# Patient Record
Sex: Female | Born: 1990 | Hispanic: Yes | Marital: Married | Smoking: Current some day smoker
Health system: Southern US, Community
[De-identification: ages and names within clinical notes are randomized; demographics above are authoritative.]

## PROBLEM LIST (undated history)

## (undated) DIAGNOSIS — J45909 Unspecified asthma, uncomplicated: Secondary | ICD-10-CM

## (undated) HISTORY — DX: Unspecified asthma, uncomplicated: J45.909

## (undated) HISTORY — PX: DIAGNOSTIC LAPAROSCOPY WITH REMOVAL OF ECTOPIC PREGNANCY: SHX6449

## (undated) HISTORY — PX: DILATION AND CURETTAGE OF UTERUS: SHX78

---
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
## 2009-05-28 HISTORY — PX: OTHER SURGICAL HISTORY: SHX169

## 2009-10-21 ENCOUNTER — Emergency Department: Payer: Self-pay | Admitting: Emergency Medicine

## 2010-05-28 HISTORY — PX: DILATION AND CURETTAGE OF UTERUS: SHX78

## 2010-05-28 HISTORY — PX: DIAGNOSTIC LAPAROSCOPY WITH REMOVAL OF ECTOPIC PREGNANCY: SHX6449

## 2010-08-02 ENCOUNTER — Emergency Department: Payer: Self-pay | Admitting: Emergency Medicine

## 2011-04-20 ENCOUNTER — Emergency Department: Payer: Self-pay | Admitting: Emergency Medicine

## 2011-04-22 ENCOUNTER — Inpatient Hospital Stay: Payer: Self-pay | Admitting: Obstetrics and Gynecology

## 2011-10-09 ENCOUNTER — Emergency Department: Payer: Self-pay | Admitting: Emergency Medicine

## 2011-10-09 LAB — COMPREHENSIVE METABOLIC PANEL
Albumin: 4.3 g/dL (ref 3.4–5.0)
BUN: 10 mg/dL (ref 7–18)
Bilirubin,Total: 0.2 mg/dL (ref 0.2–1.0)
Calcium, Total: 9.1 mg/dL (ref 8.5–10.1)
Chloride: 106 mmol/L (ref 98–107)
Co2: 24 mmol/L (ref 21–32)
Creatinine: 0.54 mg/dL — ABNORMAL LOW (ref 0.60–1.30)
EGFR (African American): >60
Glucose: 86 mg/dL (ref 65–99)
Osmolality: 278 (ref 275–301)
Sodium: 140 mmol/L (ref 136–145)

## 2011-10-09 LAB — URINALYSIS, COMPLETE
Bilirubin,UR: NEGATIVE
Blood: NEGATIVE
Ketone: NEGATIVE
Nitrite: NEGATIVE
Protein: NEGATIVE
RBC,UR: 1 /HPF (ref 0–5)
Squamous Epithelial: 4

## 2011-10-09 LAB — PREGNANCY, URINE: Pregnancy Test, Urine: NEGATIVE m[IU]/mL

## 2011-10-09 LAB — CBC
MCH: 30.6 pg (ref 26.0–34.0)
MCV: 90 fL (ref 80–100)
Platelet: 310 10*3/uL (ref 150–440)
RBC: 4.47 10*6/uL (ref 3.80–5.20)
RDW: 13.6 % (ref 11.5–14.5)
WBC: 13.2 10*3/uL — ABNORMAL HIGH (ref 3.6–11.0)

## 2012-05-28 HISTORY — PX: WISDOM TOOTH EXTRACTION: SHX21

## 2012-10-09 ENCOUNTER — Emergency Department: Payer: Self-pay | Admitting: Emergency Medicine

## 2012-10-09 LAB — URINALYSIS, COMPLETE
Bilirubin,UR: NEGATIVE
Blood: NEGATIVE
Glucose,UR: NEGATIVE mg/dL (ref 0–75)
Nitrite: NEGATIVE
Ph: 5 (ref 4.5–8.0)
WBC UR: 3 /HPF (ref 0–5)

## 2012-10-09 LAB — WET PREP, GENITAL

## 2012-10-09 LAB — CBC
HCT: 36.9 % (ref 35.0–47.0)
MCH: 30.5 pg (ref 26.0–34.0)
MCHC: 33.9 g/dL (ref 32.0–36.0)
Platelet: 283 10*3/uL (ref 150–440)
RBC: 4.1 10*6/uL (ref 3.80–5.20)
RDW: 13.2 % (ref 11.5–14.5)

## 2012-10-09 LAB — BASIC METABOLIC PANEL
BUN: 9 mg/dL (ref 7–18)
Calcium, Total: 9.3 mg/dL (ref 8.5–10.1)
Co2: 25 mmol/L (ref 21–32)
EGFR (African American): >60
EGFR (Non-African Amer.): >60
Potassium: 4 mmol/L (ref 3.5–5.1)

## 2012-10-09 LAB — HCG, QUANTITATIVE, PREGNANCY: Beta Hcg, Quant.: 85810 m[IU]/mL — ABNORMAL HIGH

## 2012-10-10 LAB — GC/CHLAMYDIA PROBE AMP

## 2014-06-29 IMAGING — US US OB < 14 WEEKS - US OB TV
1 series · 14 of 28 positions shown · non-contrast
Comparison: none

REASON FOR EXAM: L pelvic pain
COMMENTS:

PROCEDURE:     US  - US OB LESS THAN 14 WEEKS/W TRANS  - October 09, 2012 [DATE]
RESULT:
Procedure: Real-time transabdominal and transvaginal obstetrical ultrasound
complete was obtained of the maternal pelvis at a less than 14 week
gestation. Image documentation was performed.

[Series 1: us ob < 14 weeks - us ob tv · 0.23mm/px · 45 acquisitions, 14 frames shown]
[im 2/45]
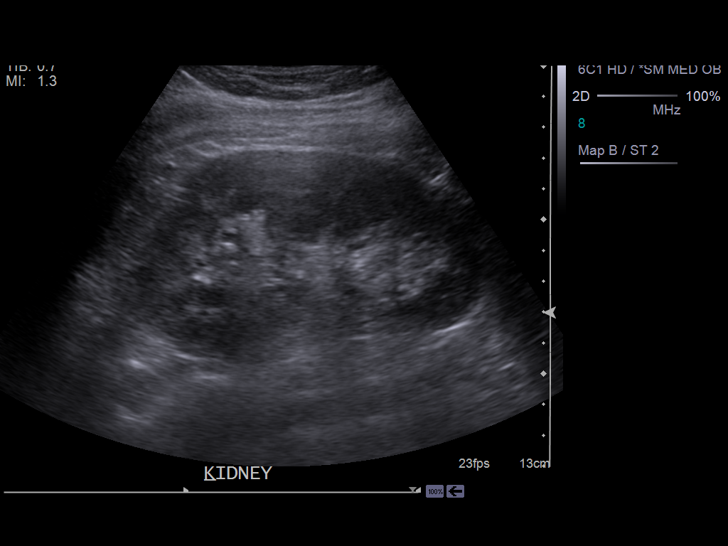
[im 5/45]
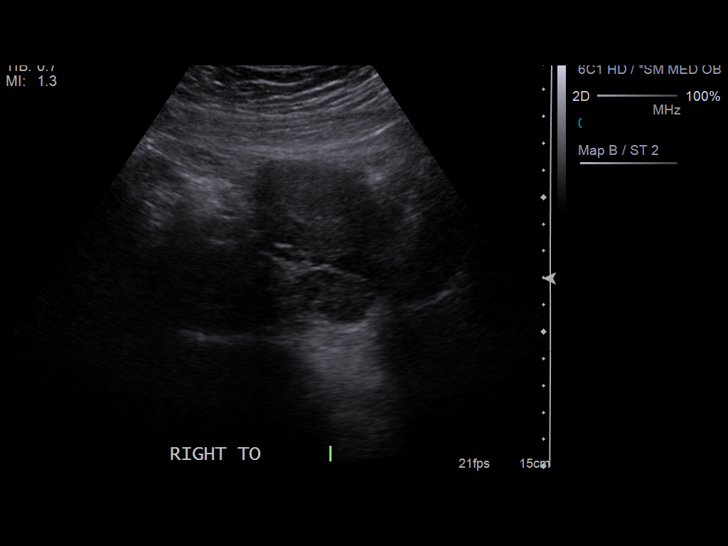
[im 9/45]
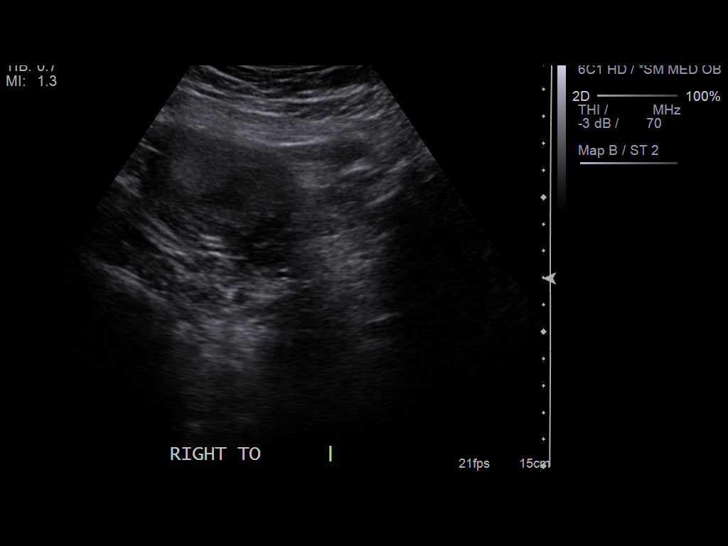
[im 12/45]
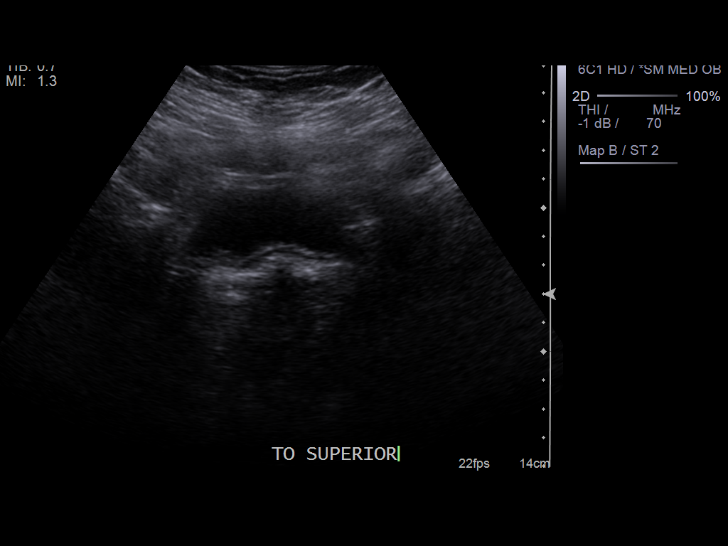
[im 15/45]
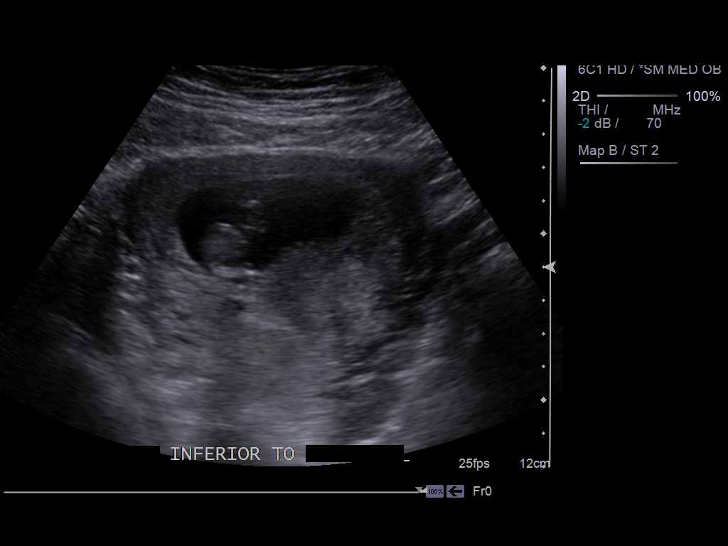
[im 18/45]
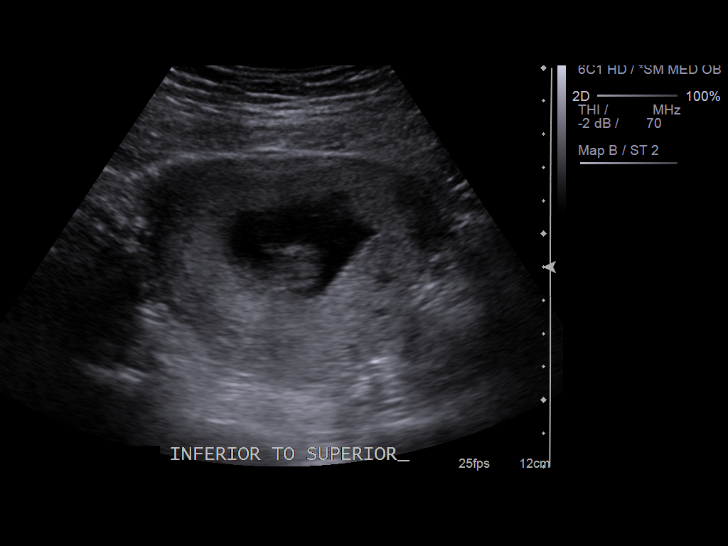
[im 22/45]
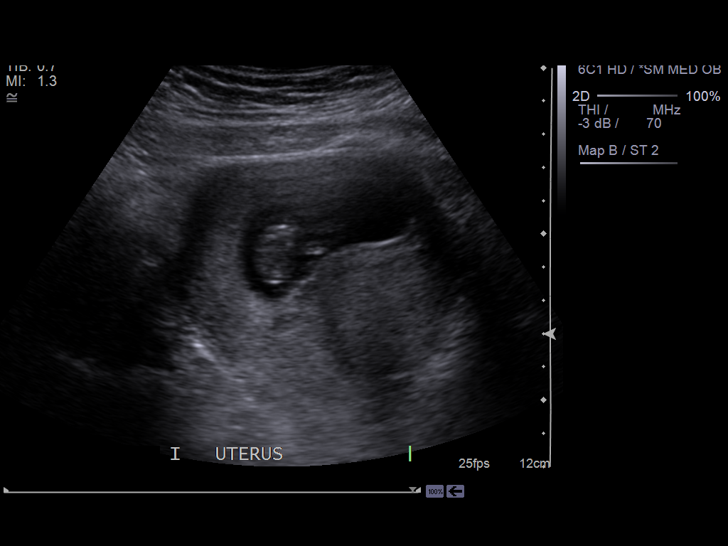
[im 25/45]
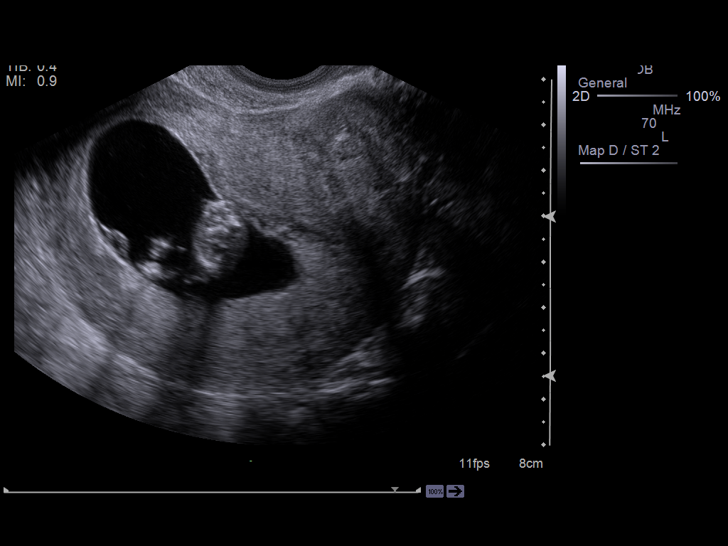
[im 28/45]
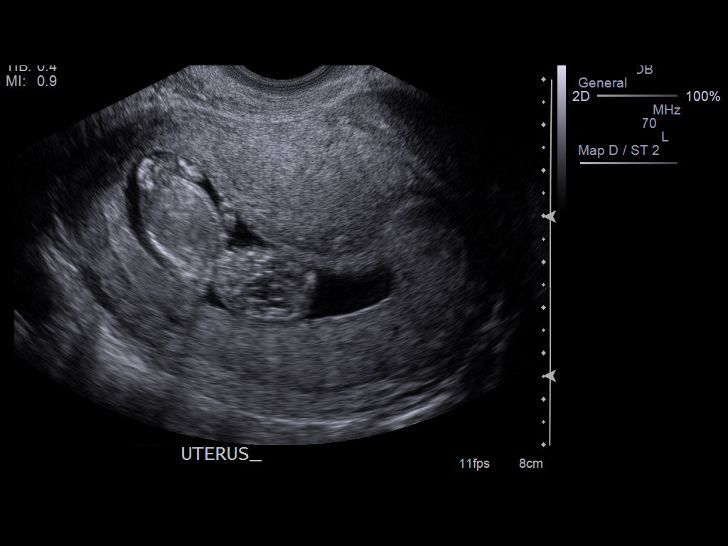
[im 31/45]
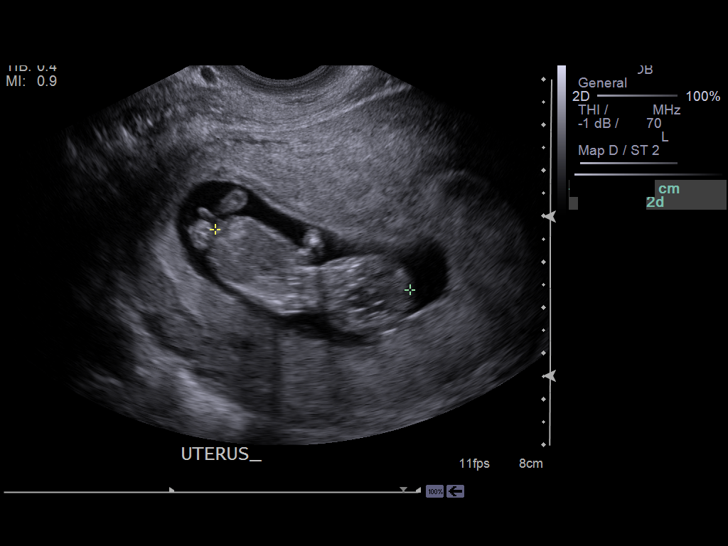
[im 35/45]
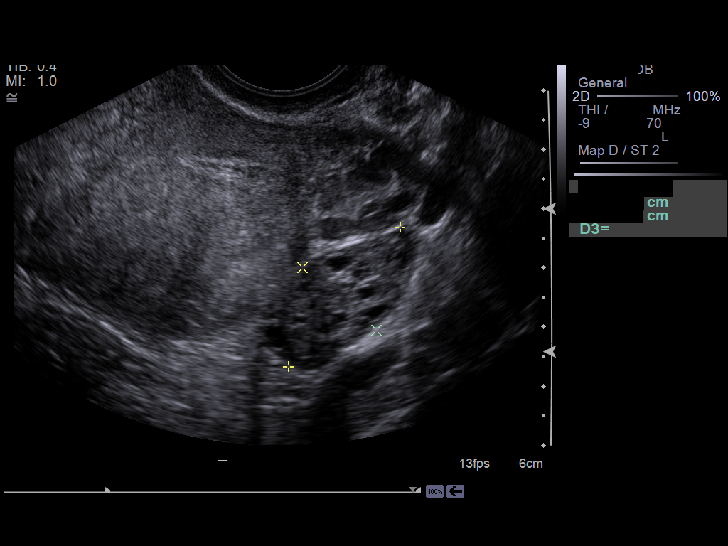
[im 38/45]
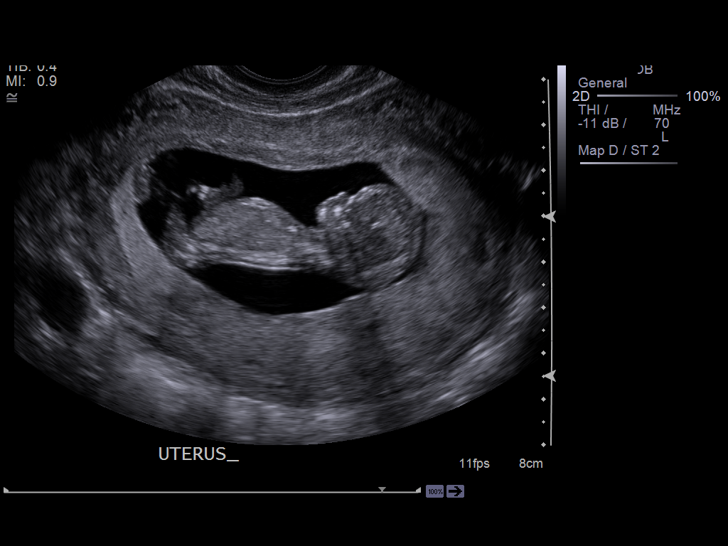
[im 41/45]
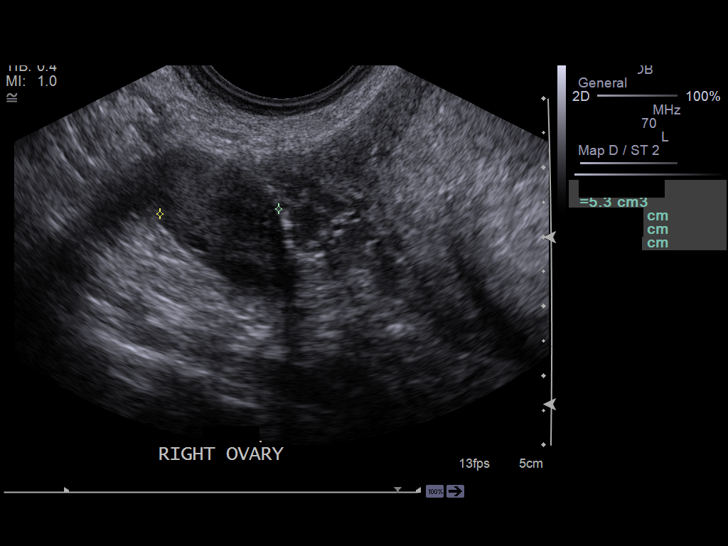
[im 45/45]
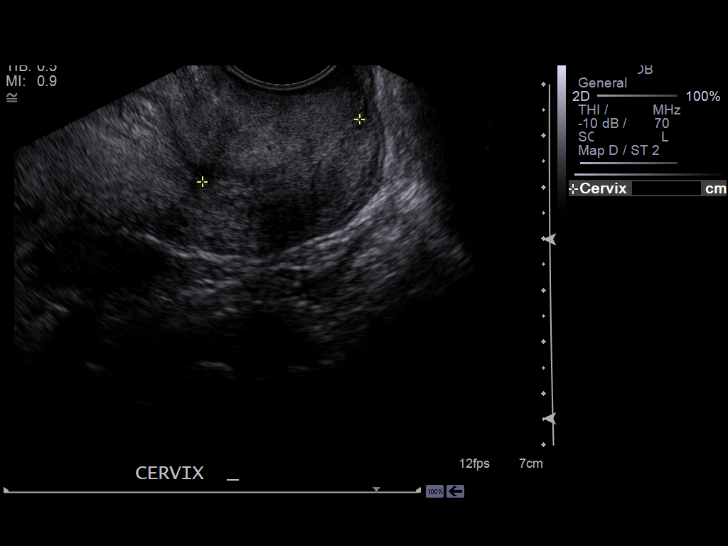

[14 of 28 positions shown; findings below may reference images not displayed]

FINDINGS: A single viable intrauterine pregnancy with estimated gestational
age based upon crown-rump length measurements of 11 weeks-5 days. Cardiac
activity is appreciated with a rate of 153 beats per minute. The uterus is
unremarkable. The ovaries are unremarkable. There no evidence of pelvic free
fluid or loculated fluid collections.
IMPRESSION: 1. Single viable intrauterine pregnancy as described above.
2. Dr. Rivard of the Emergency Department was informed of these findings
via preliminary faxed report.

## 2018-08-11 ENCOUNTER — Encounter: Payer: Self-pay | Admitting: Advanced Practice Midwife

## 2018-08-11 ENCOUNTER — Other Ambulatory Visit: Payer: Self-pay

## 2018-08-11 ENCOUNTER — Other Ambulatory Visit (HOSPITAL_COMMUNITY)
Admission: RE | Admit: 2018-08-11 | Discharge: 2018-08-11 | Disposition: A | Discharge status: Home / Self Care | Payer: Medicaid Other | Source: Ambulatory Visit | Attending: Advanced Practice Midwife | Admitting: Advanced Practice Midwife

## 2018-08-11 ENCOUNTER — Ambulatory Visit (INDEPENDENT_AMBULATORY_CARE_PROVIDER_SITE_OTHER): Payer: Medicaid Other | Admitting: Advanced Practice Midwife

## 2018-08-11 VITALS — BP 108/66 | HR 93 | Ht 64.0 in | Wt 187.0 lb

## 2018-08-11 DIAGNOSIS — Z113 Encounter for screening for infections with a predominantly sexual mode of transmission: Secondary | ICD-10-CM

## 2018-08-11 DIAGNOSIS — Z3481 Encounter for supervision of other normal pregnancy, first trimester: Secondary | ICD-10-CM | POA: Diagnosis not present

## 2018-08-11 DIAGNOSIS — Z124 Encounter for screening for malignant neoplasm of cervix: Secondary | ICD-10-CM | POA: Diagnosis not present

## 2018-08-11 DIAGNOSIS — Z3A01 Less than 8 weeks gestation of pregnancy: Secondary | ICD-10-CM

## 2018-08-11 DIAGNOSIS — Z3480 Encounter for supervision of other normal pregnancy, unspecified trimester: Secondary | ICD-10-CM

## 2018-08-11 NOTE — Progress Notes (Signed)
New Obstetric Patient H&P    Chief Complaint: "Desires prenatal care"   History of Present Illness: Patient is a 28 y.o. [id number] Hispanic or Latino female, presents with amenorrhea and positive home pregnancy test. Patient's last menstrual period was 06/21/2018 (exact date). and based on her  LMP, her EDD is Estimated Date of Delivery: 03/28/19 and her EGA is [redacted]w[redacted]d. Cycles are 5. days, irregular, and occur approximately every : 1-2 months. Her last pap smear was 3 years ago and was no abnormalities.    She had a urine pregnancy test which was positive 3 week(s)  ago. Her last menstrual period was normal and lasted for  5 or 6 day(s). Since her LMP she claims she has experienced breast tenderness, fatigue, nausea. She denies vaginal bleeding. Her past medical history is noncontributory. Her prior pregnancies are notable for FT SVD 2010, D&C following SAB August 2012 and laparoscopic surgery for ruptured ectopic pregnancy November 2012, FT SVD 2014, FT SVD 2016, FT SVD 2018- all in [place], [place].  Since her LMP, she admits to the use of tobacco products  She quit with +HPT She claims she has gained   3 pounds since the start of her pregnancy.  There are cats in the home in the home  no  She admits close contact with children on a regular basis  yes  She has had chicken pox in the past yes She has had Tuberculosis exposures, symptoms, or previously tested positive for TB   no Current or past history of domestic violence. no  Genetic Screening/Teratology Counseling: (Includes patient, baby's father, or anyone in either family with:)   1. Patient's age >/= 66 at [facility] [address]  no 2. Thalassemia ([place], [place], Mediterranean, or Asian background): MCV<80  no 3. Neural tube defect (meningomyelocele, spina bifida, anencephaly)  no 4. Congenital heart defect  no  5. Down syndrome  no 6. Tay-Sachs (Jewish, [place]  no 7. Canavan's Disease  no 8. Sickle cell disease or trait (African)  no  9.  Hemophilia or other blood disorders  no  10. Muscular dystrophy  no  11. Cystic fibrosis  no  12. Huntington's Chorea  no  13. Mental retardation/autism  no 14. Other inherited genetic or chromosomal disorder  no 15. Maternal metabolic disorder (DM, PKU, etc)  no 16. Patient or FOB with a child with a birth defect not listed above no  16a. Patient or FOB with a birth defect themselves no 17. Recurrent pregnancy loss, or stillbirth  no  18. Any medications since LMP other than prenatal vitamins (include vitamins, supplements, OTC meds, drugs, alcohol)  no 19. Any other genetic/environmental exposure to discuss  no  Infection History:   1. Lives with someone with TB or TB exposed  no  2. Patient or partner has history of genital herpes  no 3. Rash or viral illness since LMP  no 4. History of STI (GC, CT, HPV, syphilis, HIV)  no 5. History of recent travel :  no  Other pertinent information:  Patient desires tubal ligation after pregnancy    Review of Systems:10 point review of systems negative unless otherwise noted in HPI  Past Medical History:  Past Medical History:  Diagnosis Date  . Asthma     Past Surgical History:  Past Surgical History:  Procedure Laterality Date  . DIAGNOSTIC LAPAROSCOPY WITH REMOVAL OF ECTOPIC PREGNANCY     Left tube  . DILATION AND CURETTAGE OF UTERUS      Gynecologic History:  Patient's last menstrual period was 06/21/2018 (exact date).  Obstetric History: [id number]  Family History:  History reviewed. No pertinent family history.  No family history of Gyn cancer  Social History:  Social History   Socioeconomic History  . Marital status: Married    Spouse name: Not on file  . Number of children: Not on file  . Years of education: Not on file  . Highest education level: Not on file  Occupational History  . Not on file  Social Needs  . Financial resource strain: Not on file  . Food insecurity:    Worry: Not on file    Inability: Not  on file  . Transportation needs:    Medical: Not on file    Non-medical: Not on file  Tobacco Use  . Smoking status: Former Games developer  . Smokeless tobacco: Never Used  . Tobacco comment: Quit with 2020 pregnancy  Substance and Sexual Activity  . Alcohol use: Not on file  . Drug use: Not on file  . Sexual activity: Yes    Birth control/protection: None  Lifestyle  . Physical activity:    Days per week: Not on file    Minutes per session: Not on file  . Stress: Not on file  Relationships  . Social connections:    Talks on phone: Not on file    Gets together: Not on file    Attends religious service: Not on file    Active member of club or organization: Not on file    Attends meetings of clubs or organizations: Not on file    Relationship status: Not on file  . Intimate partner violence:    Fear of current or ex partner: Not on file    Emotionally abused: Not on file    Physically abused: Not on file    Forced sexual activity: Not on file  Other Topics Concern  . Not on file  Social History Narrative  . Not on file    Allergies:  Allergies  Allergen Reactions  . Penicillins Hives    As a child    Medications: Prior to Admission medications   Medication Sig Start Date End Date Taking? Authorizing Provider  albuterol (PROVENTIL HFA;VENTOLIN HFA) 108 (90 Base) MCG/ACT inhaler INL 1 TO 2 PFS PO Q 4 TO 6 H PRF [facility] 07/28/18   [provider]    Physical Exam Vitals: Blood pressure 108/66, pulse 93, height 5\' 4"  (1.626 m), weight 187 lb (84.8 kg), last menstrual period 06/23/2018.  General: NAD HEENT: normocephalic, anicteric Thyroid: no enlargement, no palpable nodules Pulmonary: No increased work of breathing, CTAB Cardiovascular: RRR, distal pulses 2+ Abdomen: NABS, soft, non-tender, non-distended.  Umbilicus without lesions.  No hepatomegaly, splenomegaly or masses palpable. No evidence of hernia  Genitourinary:  External: Normal external female genitalia.   Normal urethral meatus, normal Bartholin's and Skene's glands.    Vagina: Normal vaginal mucosa, no evidence of prolapse.    Cervix: Grossly normal in appearance, no bleeding, no CMT  Uterus: non-tender  Adnexa: non-tender  Rectal: deferred Extremities: no edema, erythema, or tenderness Neurologic: Grossly intact Psychiatric: mood appropriate, affect full   Assessment: 28 y.o. [id number] at [redacted]w[redacted]d presenting to initiate prenatal care  Plan: 1) Avoid alcoholic beverages. 2) Patient encouraged not to smoke.  3) Discontinue the use of all non-medicinal drugs and chemicals.  4) Take prenatal vitamins daily.  5) Nutrition, food safety (fish, cheese advisories, and high nitrite foods) and exercise discussed. 6) Hospital and practice  style discussed with cross coverage system.  7) Genetic Screening, such as with 1st Trimester Screening, cell free fetal DNA, AFP testing, and Ultrasound, as well as with amniocentesis and CVS as appropriate, is discussed with patient. At the conclusion of today's visit patient requested cell free DNA genetic testing 8) Patient is asked about travel to areas at risk for the Zika virus, and counseled to avoid travel and exposure to mosquitoes or sexual partners who may have themselves been exposed to the virus. Testing is discussed, and will be ordered as appropriate.  9) PAPtima and Urine Culture done today 10) Dating at [facility] followed by ROB in clinic- 1-2 weeks 11) NOB panel, early 1 hr gtt and MaterniT 21 at 10+ week visit    Tresea Mall, CNM [facility], [facility] 08/11/2018, 4:16 PM

## 2018-08-11 NOTE — Progress Notes (Signed)
NOB 

## 2018-08-11 NOTE — Patient Instructions (Signed)
Exercise During Pregnancy For people of all ages, exercise is an important part of being healthy. Exercise improves heart and lung function and helps to maintain strength, flexibility, and a healthy body weight. Exercise also boosts energy levels and elevates mood. For most women, maintaining an exercise routine throughout pregnancy is recommended. It is only on rare occasions and with certain medical conditions or pregnancy complications that women may be asked to limit or avoid exercise during pregnancy. What are some other benefits to exercising during pregnancy? Along with maintaining strength and flexibility, exercising throughout pregnancy can help to:  Keep strength in muscles that are very important during labor and childbirth.  Decrease low back pain during pregnancy.  Decrease the risk of developing gestational diabetes mellitus (GDM).  Improve blood sugar (glucose) control for women who have GDM.  Decrease the risk of developing preeclampsia. This is a serious condition that causes high blood pressure along with other symptoms, such as swelling and headaches.  Decrease the risk of cesarean delivery.  Speed up the recovery after giving birth. How often should I exercise? Unless your health care provider gives you different instructions, you should try to exercise on most days or all days of the week. In general, try to exercise with moderate intensity for about 150 minutes per week. This can be spread out across several days, such as exercising for 30 minutes per day on 5 days of each week. You can tell that you are exercising at a moderate intensity if you have a higher heart rate and faster breathing, but you are still able to hold a conversation. What types of moderate-intensity exercise are recommended during pregnancy? There are many types of exercise that are safe for you to do during pregnancy. Unless your health care provider gives you different instructions, do a variety of  exercises that safely increase your heart and breathing (cardiopulmonary) rates and help you to build and maintain muscle strength (strength training). You should always be able to talk in full sentences while exercising during pregnancy. Some examples of exercising that is safe to do during pregnancy include:  Brisk walking or hiking.  Swimming.  Water aerobics.  Riding a stationary bike.  Strength training.  Modified yoga or Pilates. Tell your instructor that you are pregnant. Avoid overstretching and avoid lying on your back for long periods of time.  Running or jogging. Only choose this type of exercise if: ? You ran or jogged regularly before your pregnancy. ? You can run or jog and still talk in complete sentences. What types of exercise should I not do during pregnancy? Depending on your level of fitness and whether you exercised regularly before your pregnancy, you may be advised to limit vigorous-intensity exercise during your pregnancy. You can tell that you are exercising at a vigorous intensity if you are breathing much harder and faster and cannot hold a conversation while exercising. Some examples of exercising that you should avoid during pregnancy include:  Contact sports.  Activities that place you at risk for falling on or being hit in the belly, such as downhill skiing, water skiing, surfing, rock climbing, cycling, gymnastics, and horseback riding.  Scuba diving.  Sky diving.  Yoga or Pilates in a room that is heated to extreme temperatures ("hot yoga" or "hot Pilates").  Jogging or running, unless you ran or jogged regularly before your pregnancy. While jogging or running, you should always be able to talk in full sentences. Do not run or jog so vigorously that you   are unable to have a conversation.  If you are not used to exercising at elevation (more than 6,000 feet above sea level), do not do so during your pregnancy. When should I avoid exercising during  pregnancy? Certain medical conditions can make it unsafe to exercise during pregnancy, or they may increase your risk of miscarriage or early labor and birth. Some of these conditions include:  Some types of heart disease.  Some types of lung disease.  Placenta previa. This is when the placenta partially or completely covers the opening of the uterus (cervix).  Frequent bleeding from the vagina during your pregnancy.  Incompetent cervix. This is when your cervix does not remain as tightly closed during pregnancy as it should.  Premature labor.  Ruptured membranes. This is when the protective sac (amniotic sac) opens up and amniotic fluid leaks from your vagina.  Severely low blood count (anemia).  Preeclampsia or pregnancy-caused high blood pressure.  Carrying more than one baby (multiple gestation) and having an additional risk of early labor.  Poorly controlled diabetes.  Being severely underweight or severely overweight.  Intrauterine growth restriction. This is when your baby's growth and development during pregnancy are slower than expected.  Other medical conditions. Ask your health care provider if any apply to you. What else should I know about exercising during pregnancy? You should take these precautions while exercising during pregnancy:  Avoid overheating. ? Wear loose-fitting, breathable clothes. ? Do not exercise in very high temperatures.  Avoid dehydration. Drink enough water before, during, and after exercise to keep your urine clear or pale yellow.  Avoid overstretching. Because of hormone changes during pregnancy, it is easy to overstretch muscles, tendons, and ligaments during pregnancy.  Start slowly and ask your health care provider to recommend types of exercise that are safe for you, if exercising regularly is new for you. Pregnancy is not a time for exercising to lose weight. When should I seek medical care? You should stop exercising and call your  health care provider if you have any unusual symptoms, such as:  Mild uterine contractions or abdominal cramping.  Dizziness that does not improve with rest. When should I seek immediate medical care? You should stop exercising and call your local emergency services (911 in the [place] if you have any unusual symptoms, such as:  Sudden, severe pain in your low back or your belly.  Uterine contractions or abdominal cramping that do not improve with rest.  Chest pain.  Bleeding or fluid leaking from your vagina.  Shortness of breath. This information is not intended to replace advice given to you by your health care provider. Make sure you discuss any questions you have with your health care provider. Document Released: 05/14/2005 Document Revised: 10/12/2015 Document Reviewed: 07/22/2014 Elsevier Interactive Patient Education  2019 [employer] Eating Plan for Pregnant Women While you are pregnant, your body requires additional nutrition to help support your growing baby. You also have a higher need for some vitamins and minerals, such as folic acid, calcium, iron, and vitamin D. Eating a healthy, well-balanced diet is very important for your health and your baby's health. Your need for extra calories varies for the three 3-month segments of your pregnancy (trimesters). For most women, it is recommended to consume:  150 extra calories a day during the first trimester.  300 extra calories a day during the second trimester.  300 extra calories a day during the third trimester. What are tips for following this plan?   Do   not try to lose weight or go on a diet during pregnancy.  Limit your overall intake of foods that have "empty calories." These are foods that have little nutritional value, such as sweets, desserts, candies, and sugar-sweetened beverages.  Eat a variety of foods (especially fruits and vegetables) to get a full range of vitamins and minerals.  Take a prenatal vitamin  to help meet your additional vitamin and mineral needs during pregnancy, specifically for folic acid, iron, calcium, and vitamin D.  Remember to stay active. Ask your health care provider what types of exercise and activities are safe for you.  Practice good food safety and cleanliness. Wash your hands before you eat and after you prepare raw meat. Wash all fruits and vegetables well before peeling or eating. Taking these actions can help to prevent food-borne illnesses that can be very dangerous to your baby, such as listeriosis. Ask your health care provider for more information about listeriosis. What does 150 extra calories look like? Healthy options that provide 150 extra calories each day could be any of the following:  6-8 oz (170-230 g) of plain low-fat yogurt with  cup of berries.  1 apple with 2 teaspoons (11 g) of peanut butter.  Cut-up vegetables with  cup (60 g) of hummus.  8 oz (230 mL) or 1 cup of low-fat chocolate milk.  1 stick of string cheese with 1 medium orange.  1 peanut butter and jelly sandwich that is made with one slice of whole-wheat bread and 1 tsp (5 g) of peanut butter. For 300 extra calories, you could eat two of those healthy options each day. What is a healthy amount of weight to gain? The right amount of weight gain for you is based on your BMI before you became pregnant. If your BMI:  Was less than 18 (underweight), you should gain 28-40 lb (13-18 kg).  Was 18-24.9 (normal), you should gain 25-35 lb (11-16 kg).  Was 25-29.9 (overweight), you should gain 15-25 lb (7-11 kg).  Was 30 or greater (obese), you should gain 11-20 lb (5-9 kg). What if I am having twins or multiples? Generally, if you are carrying twins or multiples:  You may need to eat 300-600 extra calories a day.  The recommended range for total weight gain is 25-54 lb (11-25 kg), depending on your BMI before pregnancy.  Talk with your health care provider to find out about  nutritional needs, weight gain, and exercise that is right for you. What foods can I eat?  Grains All grains. Choose whole grains, such as whole-wheat bread, oatmeal, or brown rice. Vegetables All vegetables. Eat a variety of colors and types of vegetables. Remember to wash your vegetables well before peeling or eating. Fruits All fruits. Eat a variety of colors and types of fruit. Remember to wash your fruits well before peeling or eating. Meats and other protein foods Lean meats, including chicken, turkey, fish, and lean cuts of beef, veal, or pork. If you eat fish or seafood, choose options that are higher in omega-3 fatty acids and lower in mercury, such as salmon, herring, mussels, trout, sardines, pollock, shrimp, crab, and lobster. Tofu. Tempeh. Beans. Eggs. Peanut butter and other nut butters. Make sure that all meats, poultry, and eggs are cooked to food-safe temperatures or "well-done." Two or more servings of fish are recommended each week in order to get the most benefits from omega-3 fatty acids that are found in seafood. Choose fish that are lower in mercury. You can   find more information online:  www.fda.gov Dairy Pasteurized milk and milk alternatives (such as almond milk). Pasteurized yogurt and pasteurized cheese. Cottage cheese. Sour cream. Beverages Water. Juices that contain 100% fruit juice or vegetable juice. Caffeine-free teas and decaffeinated coffee. Drinks that contain caffeine are okay to drink, but it is better to avoid caffeine. Keep your total caffeine intake to less than 200 mg each day (which is 12 oz or 355 mL of coffee, tea, or soda) or the limit as told by your health care provider. Fats and oils Fats and oils are okay to include in moderation. Sweets and desserts Sweets and desserts are okay to include in moderation. Seasoning and other foods All pasteurized condiments. The items listed above may not be a complete list of recommended foods and beverages.  Contact your dietitian for more options. What foods are not recommended? Vegetables Raw (unpasteurized) vegetable juices. Fruits Unpasteurized fruit juices. Meats and other protein foods Lunch meats, bologna, hot dogs, or other deli meats. (If you must eat those meats, reheat them until they are steaming hot.) Refrigerated pat, meat spreads from a meat counter, smoked seafood that is found in the refrigerated section of a store. Raw or undercooked meats, poultry, and eggs. Raw fish, such as sushi or sashimi. Fish that have high mercury content, such as tilefish, shark, swordfish, and king mackerel. To learn more about mercury in fish, talk with your health care provider or look for online resources, such as:  www.fda.gov Dairy Raw (unpasteurized) milk and any foods that have raw milk in them. Soft cheeses, such as feta, queso blanco, queso fresco, Brie, Camembert cheeses, blue-veined cheeses, and Panela cheese (unless it is made with pasteurized milk, which must be stated on the label). Beverages Alcohol. Sugar-sweetened beverages, such as sodas, teas, or energy drinks. Seasoning and other foods Homemade fermented foods and drinks, such as pickles, sauerkraut, or kombucha drinks. (Store-bought pasteurized versions of these are okay.) Salads that are made in a store or deli, such as ham salad, chicken salad, egg salad, tuna salad, and seafood salad. The items listed above may not be a complete list of foods and beverages to avoid. Contact your dietitian for more information. Where to find more information To calculate the number of calories you need based on your height, weight, and activity level, you can use an online calculator such as:  www.choosemyplate.gov/MyPlatePlan To calculate how much weight you should gain during pregnancy, you can use an online pregnancy weight gain calculator such as:  www.choosemyplate.gov/pregnancy-weight-gain-calculator Summary  While you are pregnant,  your body requires additional nutrition to help support your growing baby.  Eat a variety of foods, especially fruits and vegetables to get a full range of vitamins and minerals.  Practice good food safety and cleanliness. Wash your hands before you eat and after you prepare raw meat. Wash all fruits and vegetables well before peeling or eating. Taking these actions can help to prevent food-borne illnesses, such as listeriosis, that can be very dangerous to your baby.  Do not eat raw meat or fish. Do not eat fish that have high mercury content, such as tilefish, shark, swordfish, and king mackerel. Do not eat unpasteurized (raw) dairy.  Take a prenatal vitamin to help meet your additional vitamin and mineral needs during pregnancy, specifically for folic acid, iron, calcium, and vitamin D. This information is not intended to replace advice given to you by your health care provider. Make sure you discuss any questions you have with your health care   provider. Document Released: 02/26/2014 Document Revised: 02/08/2017 Document Reviewed: 02/08/2017 Elsevier Interactive Patient Education  2019 [employer] Prenatal Care Prenatal care is health care during pregnancy. It helps you and your unborn baby (fetus) stay as healthy as possible. Prenatal care may be provided by a midwife, a family practice health care provider, or a childbirth and pregnancy specialist (obstetrician). How does this affect me? During pregnancy, you will be closely monitored for any new conditions that might develop. To lower your risk of pregnancy complications, you and your health care provider will talk about any underlying conditions you have. How does this affect my baby? Early and consistent prenatal care increases the chance that your baby will be healthy during pregnancy. Prenatal care lowers the risk that your baby will be:  Born early (prematurely).  Smaller than expected at birth (small for gestational age). What  can I expect at the first prenatal care visit? Your first prenatal care visit will likely be the longest. You should schedule your first prenatal care visit as soon as you know that you are pregnant. Your first visit is a good time to talk about any questions or concerns you have about pregnancy. At your visit, you and your health care provider will talk about:  Your medical history, including: ? Any past pregnancies. ? Your family's medical history. ? The baby's father's medical history. ? Any long-term (chronic) health conditions you have and how you manage them. ? Any surgeries or procedures you have had. ? Any current over-the-counter or prescription medicines, herbs, or supplements you are taking.  Other factors that could pose a risk to your baby, including:  Your home setting and your stress levels, including: ? Exposure to abuse or violence. ? Household financial strain. ? Mental health conditions you have.  Your daily health habits, including diet and exercise. Your health care provider will also:  Measure your weight, height, and blood pressure.  Do a physical exam, including a pelvic and breast exam.  Perform blood tests and urine tests to check for: ? Urinary tract infection. ? Sexually transmitted infections (STIs). ? Low iron levels in your blood (anemia). ? Blood type and certain proteins on red blood cells (Rh antibodies). ? Infections and immunity to viruses, such as hepatitis B and rubella. ? HIV (human immunodeficiency virus).  Do an ultrasound to confirm your baby's growth and development and to help predict your estimated due date (EDD). This ultrasound is done with a probe that is inserted into the vagina (transvaginal ultrasound).  Discuss your options for genetic screening.  Give you information about how to keep yourself and your baby healthy, including: ? Nutrition and taking vitamins. ? Physical activity. ? How to manage pregnancy symptoms such as  nausea and vomiting (morning sickness). ? Infections and substances that may be harmful to your baby and how to avoid them. ? Food safety. ? Dental care. ? Working. ? Travel. ? Warning signs to watch for and when to call your health care provider. How often will I have prenatal care visits? After your first prenatal care visit, you will have regular visits throughout your pregnancy. The visit schedule is often as follows:  Up to week 28 of pregnancy: once every 4 weeks.  28-36 weeks: once every 2 weeks.  After 36 weeks: every week until delivery. Some women may have visits more or less often depending on any underlying health conditions and the health of the baby. Keep all follow-up and prenatal care visits as told by   your health care provider. This is important. What happens during routine prenatal care visits? Your health care provider will:  Measure your weight and blood pressure.  Check for fetal heart sounds.  Measure the height of your uterus in your abdomen (fundal height). This may be measured starting around week 20 of pregnancy.  Check the position of your baby inside your uterus.  Ask questions about your diet, sleeping patterns, and whether you can feel the baby move.  Review warning signs to watch for and signs of labor.  Ask about any pregnancy symptoms you are having and how you are dealing with them. Symptoms may include: ? Headaches. ? Nausea and vomiting. ? Vaginal discharge. ? Swelling. ? Fatigue. ? Constipation. ? Any discomfort, including back or pelvic pain. Make a list of questions to ask your health care provider at your routine visits. What tests might I have during prenatal care visits? You may have blood, urine, and imaging tests throughout your pregnancy, such as:  Urine tests to check for glucose, protein, or signs of infection.  Glucose tests to check for a form of diabetes that can develop during pregnancy (gestational diabetes mellitus).  This is usually done around week 24 of pregnancy.  An ultrasound to check your baby's growth and development and to check for birth defects. This is usually done around week 20 of pregnancy.  A test to check for group B strep (GBS) infection. This is usually done around week 36 of pregnancy.  Genetic testing. This may include blood or imaging tests, such as an ultrasound. Some genetic tests are done during the first trimester and some are done during the second trimester. What else can I expect during prenatal care visits? Your health care provider may recommend getting certain vaccines during pregnancy. These may include:  A yearly flu shot (annual influenza vaccine). This is especially important if you will be pregnant during flu season.  Tdap (tetanus, diphtheria, pertussis) vaccine. Getting this vaccine during pregnancy can protect your baby from whooping cough (pertussis) after birth. This vaccine may be recommended between weeks 27 and 36 of pregnancy. Later in your pregnancy, your health care provider may give you information about:  Childbirth and breastfeeding classes.  Choosing a health care provider for your baby.  Umbilical cord banking.  Breastfeeding.  Birth control after your baby is born.  The hospital labor and delivery unit and how to tour it.  Registering at the hospital before you go into labor. Where to find more information  Office on Women's Health: womenshealth.gov  American Pregnancy Association: americanpregnancy.org  March of Dimes: marchofdimes.org Summary  Prenatal care helps you and your baby stay as healthy as possible during pregnancy.  Your first prenatal care visit will most likely be the longest.  You will have visits and tests throughout your pregnancy to monitor your health and your baby's health.  Bring a list of questions to your visits to ask your health care provider.  Make sure to keep all follow-up and prenatal care visits with  your health care provider. This information is not intended to replace advice given to you by your health care provider. Make sure you discuss any questions you have with your health care provider. Document Released: 05/17/2003 Document Revised: 05/13/2017 Document Reviewed: 05/13/2017 Elsevier Interactive Patient Education  2019 [employer]  

## 2018-08-13 LAB — CYTOLOGY - PAP
CHLAMYDIA, DNA PROBE: NEGATIVE
DIAGNOSIS: NEGATIVE
NEISSERIA GONORRHEA: NEGATIVE
Trichomonas: NEGATIVE

## 2018-08-13 LAB — URINE CULTURE

## 2018-08-15 ENCOUNTER — Other Ambulatory Visit: Payer: Self-pay | Admitting: Advanced Practice Midwife

## 2018-08-15 DIAGNOSIS — Z3689 Encounter for other specified antenatal screening: Secondary | ICD-10-CM

## 2018-08-15 DIAGNOSIS — Z3480 Encounter for supervision of other normal pregnancy, unspecified trimester: Secondary | ICD-10-CM

## 2018-08-25 ENCOUNTER — Other Ambulatory Visit: Payer: Self-pay

## 2018-08-25 ENCOUNTER — Ambulatory Visit
Admission: RE | Admit: 2018-08-25 | Discharge: 2018-08-25 | Disposition: A | Discharge status: Home / Self Care | Payer: Medicaid Other | Source: Ambulatory Visit | Attending: Advanced Practice Midwife | Admitting: Advanced Practice Midwife

## 2018-08-25 ENCOUNTER — Ambulatory Visit (INDEPENDENT_AMBULATORY_CARE_PROVIDER_SITE_OTHER): Payer: Medicaid Other | Admitting: Obstetrics and Gynecology

## 2018-08-25 ENCOUNTER — Encounter: Payer: Self-pay | Admitting: Obstetrics and Gynecology

## 2018-08-25 DIAGNOSIS — Z3A09 9 weeks gestation of pregnancy: Secondary | ICD-10-CM | POA: Diagnosis not present

## 2018-08-25 DIAGNOSIS — Z3689 Encounter for other specified antenatal screening: Secondary | ICD-10-CM | POA: Insufficient documentation

## 2018-08-25 DIAGNOSIS — Z3480 Encounter for supervision of other normal pregnancy, unspecified trimester: Secondary | ICD-10-CM

## 2018-08-25 DIAGNOSIS — O208 Other hemorrhage in early pregnancy: Secondary | ICD-10-CM | POA: Diagnosis not present

## 2018-08-25 NOTE — Progress Notes (Signed)
    Virtual Visit via Telephone Note  I connected with Angela Luna on 08/25/18 at  3:30 PM EDT by telephone and verified that I am speaking with the correct person using two identifiers.   I discussed the limitations, risks, security and privacy concerns of performing an evaluation and management service by telephone and the availability of in person appointments. I also discussed with the patient that there may be a patient responsible charge related to this service. The patient expressed understanding and agreed to proceed.  She was at home and I was in my office.  History of Present Illness: 28 y.o. [id number] female at [redacted]w[redacted]d presents via telephone for a routine prenatal visit. She has no complaints. She had an ultrasound today at the hospital which confirms her EDD. There is a single, living IUP, with cardiac activity, and with a small SCH. She denies vaginal bleeding and any other concerns.    Observations/Objective: No exam today, due to telephone eVisit due to [facility] virus restriction on elective visits and procedures.  Prior visits reviewed along with ultrasounds/labs as indicated.  Assessment and Plan: Pregnancy#7 Problems (from 06/21/18 to present)    Problem Noted Resolved   Supervision of other normal pregnancy, antepartum 08/11/2018 by Tresea Mall, CNM No   Overview Signed 08/11/2018 12:27 PM by Tresea Mall, CNM    [facility] Prenatal Labs  Dating  Blood type:     Genetic Screen 1 Screen:    AFP:     Quad:     NIPS: Antibody:   Anatomic [place]  Rubella:   Varicella: @VZVIGG @  GTT Early: [ ]  Third trimester:  RPR:     Rhogam  HBsAg:     TDaP vaccine   Flu Shot: declined HIV:     Baby Food  Breast                              GBS:   Contraception Desires tubal: consent [ ]  Pap: 3/16 [ ]   CBB     CS/VBAC NA   Support Person Angela Luna             Follow Up Instructions:   I discussed the assessment and treatment plan with the patient. The patient was provided  an opportunity to ask questions and all were answered. The patient agreed with the plan and demonstrated an understanding of the instructions.   The patient was advised to call back or seek an in-person evaluation if the symptoms worsen or if the condition fails to improve as anticipated.  I provided 15 minutes of non-face-to-face time during this encounter.  Return in about 4 weeks (around 09/22/2018) for Routine Prenatal Appointment with labs(1h gtt).    Angela Mohair, MD, [name], [facility] 08/25/2018 5:12 PM

## 2018-08-25 NOTE — Patient Instructions (Signed)
   Hello,  Given the current COVID-19 pandemic, our practice is making changes in how we are providing care to our patients. We are limiting in-person visits for the safety of all of our patients.   As a practice, we have met to discuss the best way to minimize visits, but still provide excellent care to our expecting mothers.  We have decided on the following visit structure for low-risk pregnancies.  Initial Pregnancy visit will be conducted as a telephone or web visit.  Between 10-14 weeks  there will be one in-person visit for an ultrasound, lab work, and genetic screening. 20 weeks in-person visit with an anatomy ultrasound  28 weeks in-person office visit for a 1-hour glucose test and a TDAP vaccination 32 weeks in-person office visit 34 weeks telephone visit 36 weeks in-person office visit for GBS, chlamydia, and gonorrhea testing 38 weeks in-person office visit 40 weeks in-person office visit  Understandably, some patients will require more visits than what is outlined above. Additional visits will be determined on a case-by-case basis.   We will, as always, be available for emergencies or to address concerns that might arise between in-person visits. We ask that you allow [place] the opportunity to address any concerns over the phone or through a virtual visit first. We will be available to return your phone calls throughout the day.   If you are able to purchase a scale, a blood pressure machine, and a home fetal doppler visits could be limited further. This will help decrease your exposure risks, but these purchases are not a necessity.   Things seem to change daily and there is the possibility that this structure could change, please be patient as we adapt to a new way of caring for patients.   Thank you for trusting [place] with your prenatal care. Our practice values you and looks forward to providing you with excellent care.   Sincerely,   [facility], [place]

## 2018-09-22 ENCOUNTER — Other Ambulatory Visit: Payer: Self-pay

## 2018-09-22 ENCOUNTER — Other Ambulatory Visit: Payer: Medicaid Other

## 2018-09-22 ENCOUNTER — Encounter: Payer: Self-pay | Admitting: Advanced Practice Midwife

## 2018-09-22 ENCOUNTER — Ambulatory Visit (INDEPENDENT_AMBULATORY_CARE_PROVIDER_SITE_OTHER): Payer: Medicaid Other | Admitting: Advanced Practice Midwife

## 2018-09-22 VITALS — BP 108/70 | Wt 193.0 lb

## 2018-09-22 DIAGNOSIS — Z3481 Encounter for supervision of other normal pregnancy, first trimester: Secondary | ICD-10-CM

## 2018-09-22 DIAGNOSIS — Z3A13 13 weeks gestation of pregnancy: Secondary | ICD-10-CM | POA: Diagnosis not present

## 2018-09-22 DIAGNOSIS — Z3480 Encounter for supervision of other normal pregnancy, unspecified trimester: Secondary | ICD-10-CM

## 2018-09-22 NOTE — Progress Notes (Signed)
ROB/1 hr GTT- no concerns 

## 2018-09-22 NOTE — Patient Instructions (Signed)

## 2018-09-22 NOTE — Progress Notes (Signed)
  Routine Prenatal Care Visit  Subjective  Angela Luna is a 28 y.o. [id number] at [redacted]w[redacted]d being seen today for ongoing prenatal care.  She is currently monitored for the following issues for this low-risk pregnancy and has Supervision of other normal pregnancy, antepartum on their problem list.  ----------------------------------------------------------------------------------- Patient reports eczema flare up. Her eczema usually improves during pregnancy but despite her usual regimen of lotion it has gotten worse she thinks due to additional stress currently.    . Vag. Bleeding: None.   . Denies leaking of fluid.  ----------------------------------------------------------------------------------- The following portions of the patient's history were reviewed and updated as appropriate: allergies, current medications, past family history, past medical history, past social history, past surgical history and problem list. Problem list updated.   Objective  Blood pressure 108/70, weight 193 lb (87.5 kg), last menstrual period 06/21/2018. Pregravid weight 184 lb (83.5 kg) Total Weight Gain 9 lb (4.082 kg) Urinalysis: Urine Protein    Urine Glucose    Fetal Status: Fetal Heart Rate (bpm): 150         General:  Alert, oriented and cooperative. Patient is in no acute distress.  Skin: Skin is warm and dry. No rash noted.   Cardiovascular: Normal heart rate noted  Respiratory: Normal respiratory effort, no problems with respiration noted  Abdomen: Soft, gravid, appropriate for gestational age.       Pelvic:  Cervical exam deferred        Extremities: Normal range of motion.   Patches of eczema on legs especially left calf  Mental Status: Normal mood and affect. Normal behavior. Normal judgment and thought content.   Assessment   28 y.o. [id number] at [redacted]w[redacted]d by  03/28/2019, by Last Menstrual Period presenting for routine prenatal visit  Plan   Pregnancy#7 Problems (from 06/21/18 to present)    Problem Noted Resolved   Supervision of other normal pregnancy, antepartum 08/11/2018 by Tresea Mall, CNM No   Overview Signed 08/11/2018 12:27 PM by Tresea Mall, CNM    [facility] Prenatal Labs  Dating  Blood type:     Genetic Screen 1 Screen:    AFP:     Quad:     NIPS: Antibody:   Anatomic [place]  Rubella:   Varicella: @VZVIGG @  GTT Early: [ ]  Third trimester:  RPR:     Rhogam  HBsAg:     TDaP vaccine   Flu Shot: declined HIV:     Baby Food  Breast                              GBS:   Contraception Desires tubal: consent [ ]  Pap: 3/16 [ ]   CBB     CS/VBAC NA   Support Person Kle[place]              Preterm labor symptoms and general obstetric precautions including but not limited to vaginal bleeding, contractions, leaking of fluid and fetal movement were reviewed in detail with the patient. Please refer to After Visit Summary for other counseling recommendations.  OTC topical steroid cream applied to affected areas  Return in about 7 weeks (around 11/10/2018) for anatomy scan and rob.  Tresea Mall, CNM 09/22/2018 8:54 AM

## 2018-09-23 LAB — RPR+RH+ABO+RUB AB+AB SCR+CB...
Antibody Screen: NEGATIVE
HIV Screen 4th Generation wRfx: NONREACTIVE
Hematocrit: 36.5 % (ref 34.0–46.6)
Hemoglobin: 12.4 g/dL (ref 11.1–15.9)
Hepatitis B Surface Ag: NEGATIVE
MCH: 30.5 pg (ref 26.6–33.0)
MCHC: 34 g/dL (ref 31.5–35.7)
MCV: 90 fL (ref 79–97)
Platelets: 284 10*3/uL (ref 150–450)
RBC: 4.07 x10E6/uL (ref 3.77–5.28)
RDW: 12.2 % (ref 11.7–15.4)
RPR Ser Ql: NONREACTIVE
Rh Factor: POSITIVE
Rubella Antibodies, IGG: 2.18 index (ref >0.99)
Varicella zoster IgG: 2206 index (ref >165)
WBC: 9.7 10*3/uL (ref 3.4–10.8)

## 2018-09-23 LAB — GLUCOSE, 1 HOUR GESTATIONAL: Gestational Diabetes Screen: 136 mg/dL (ref 65–139)

## 2018-09-27 LAB — MATERNIT 21 PLUS CORE, BLOOD
Fetal Fraction: 8
Result (T21): NEGATIVE
Trisomy 13 (Patau syndrome): NEGATIVE
Trisomy 18 (Edwards syndrome): NEGATIVE
Trisomy 21 (Down syndrome): NEGATIVE

## 2018-11-10 ENCOUNTER — Encounter: Payer: Medicaid Other | Admitting: Maternal Newborn

## 2018-11-10 ENCOUNTER — Other Ambulatory Visit: Payer: Medicaid Other

## 2019-04-28 HISTORY — PX: TUBAL LIGATION: SHX77

## 2019-05-20 IMAGING — US OBSTETRIC <14 WK US AND TRANSVAGINAL OB US
1 series · 14 of 28 positions shown · non-contrast
Comparison: None.

CLINICAL DATA: Viability scan.

EXAM:
OBSTETRIC <14 WK US AND TRANSVAGINAL OB US
TECHNIQUE: Both transabdominal and transvaginal ultrasound examinations were
performed for complete evaluation of the gestation as well as the
maternal uterus, adnexal regions, and pelvic cul-de-sac.
Transvaginal technique was performed to assess early pregnancy.

[Series 1: obstetric <14 wk us and transvaginal ob us · 0.17mm/px · 130 acquisitions, 14 frames shown]
[im 5/130]
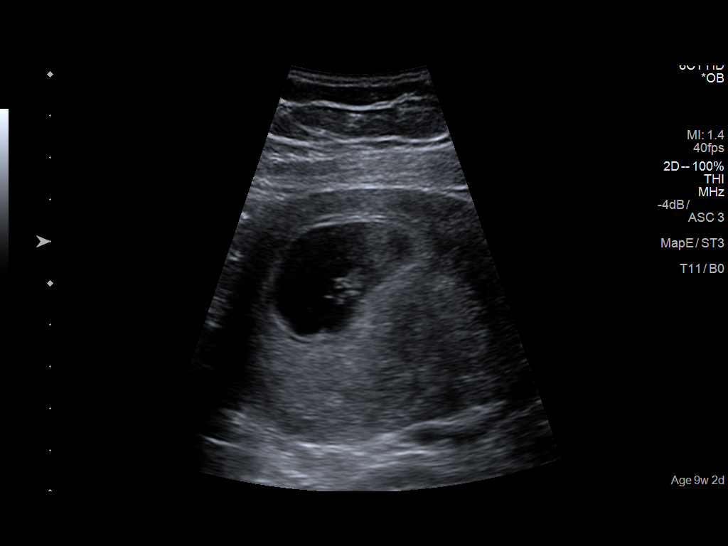
[im 15/130]
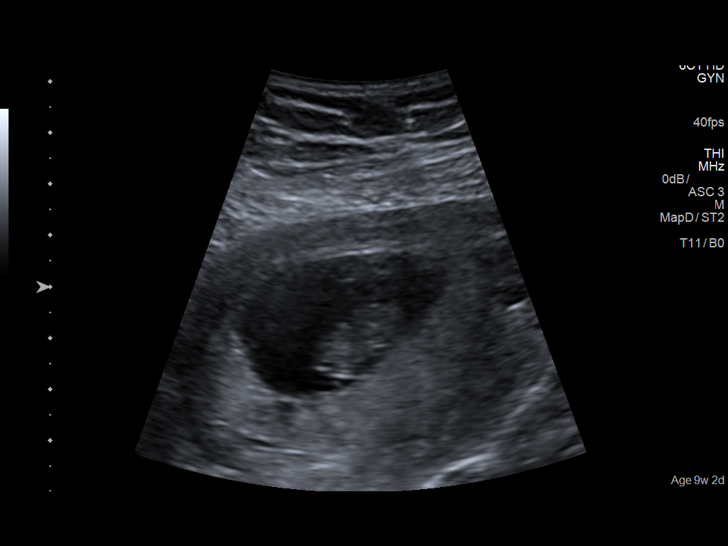
[im 24/130]
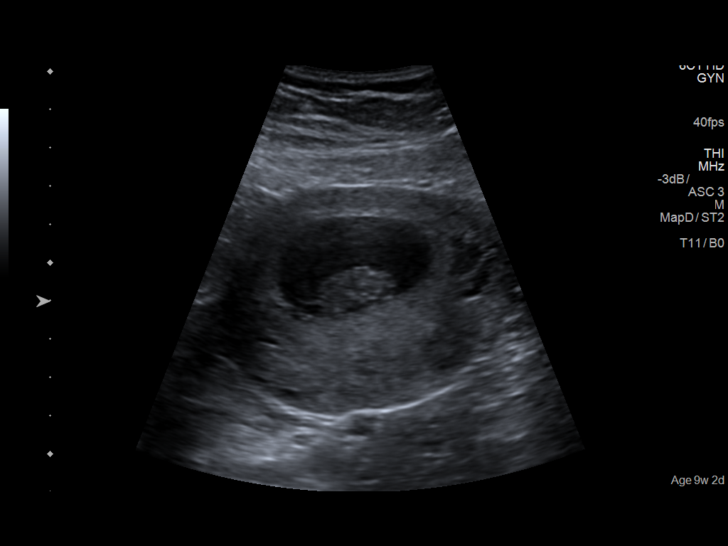
[im 34/130]
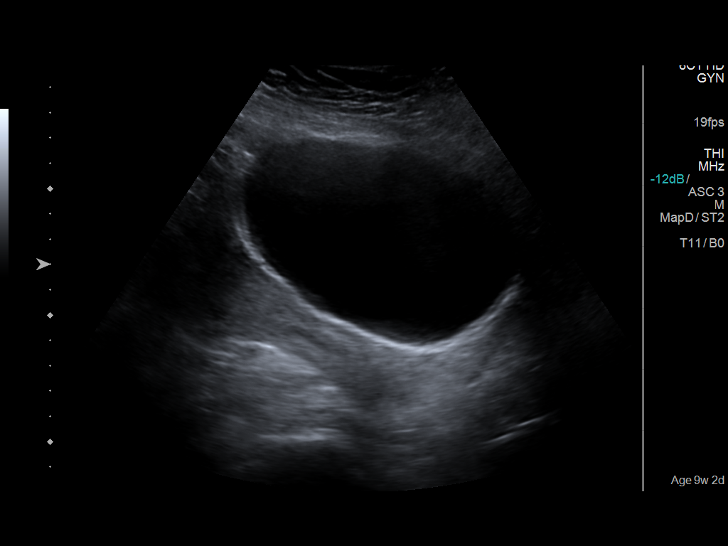
[im 44/130]
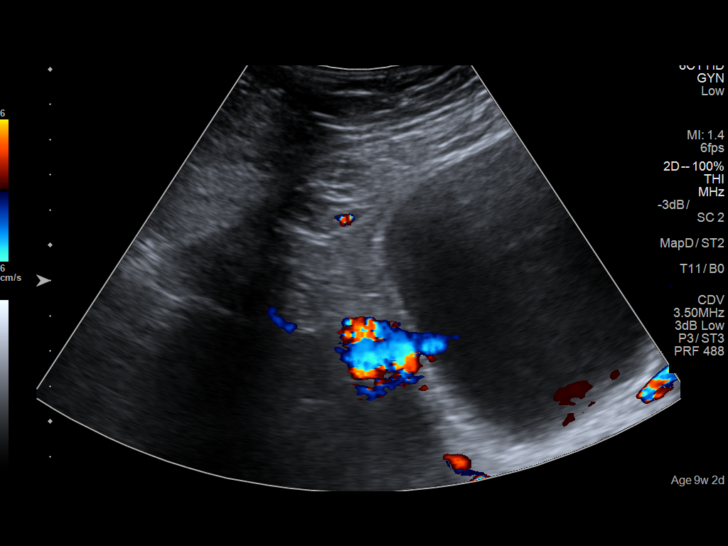
[im 53/130]
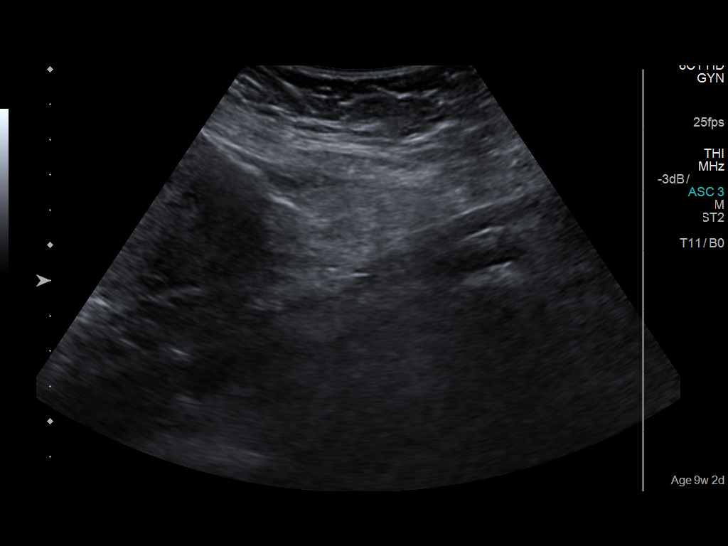
[im 63/130]
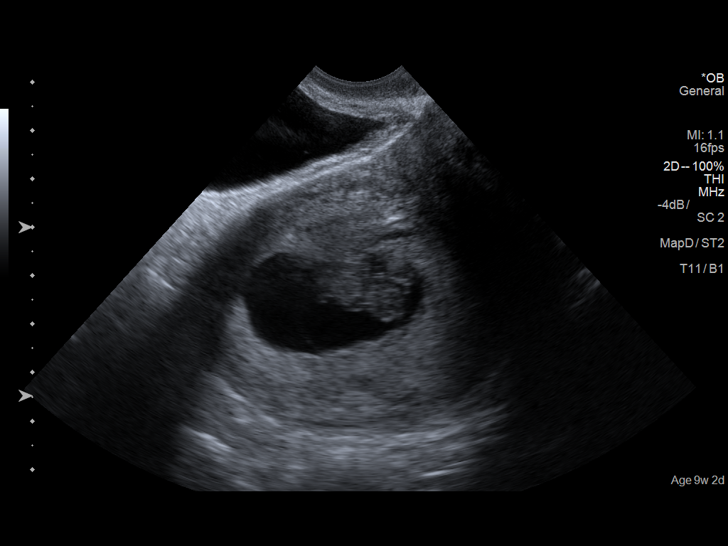
[im 72/130]
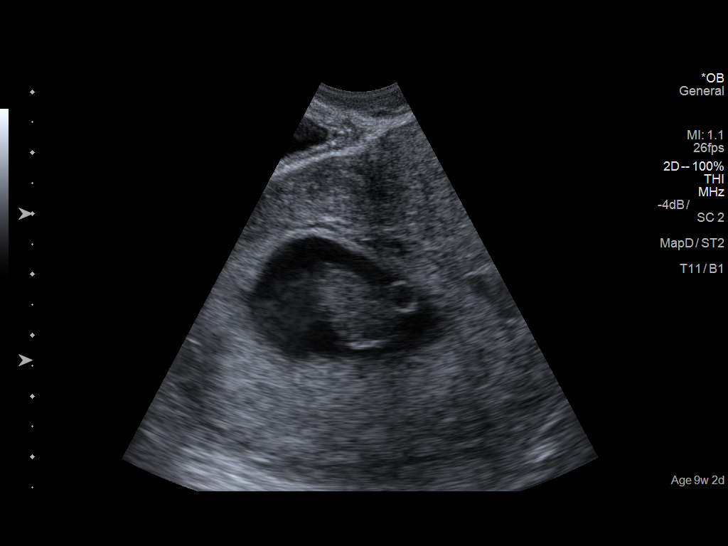
[im 82/130]
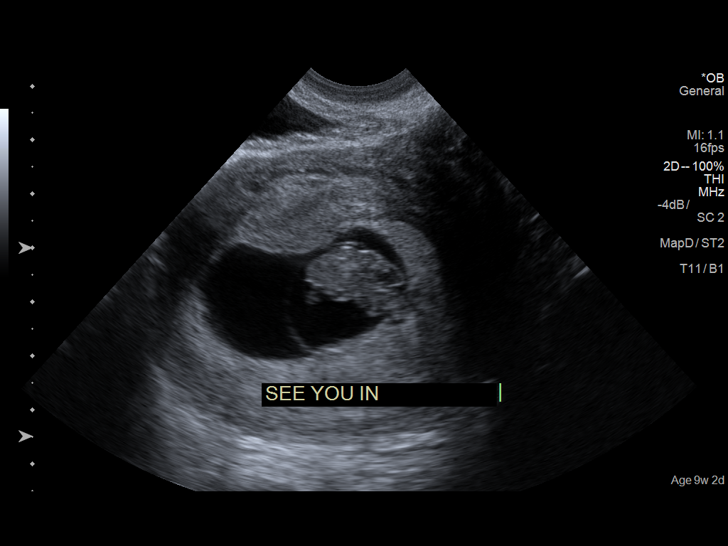
[im 91/130]
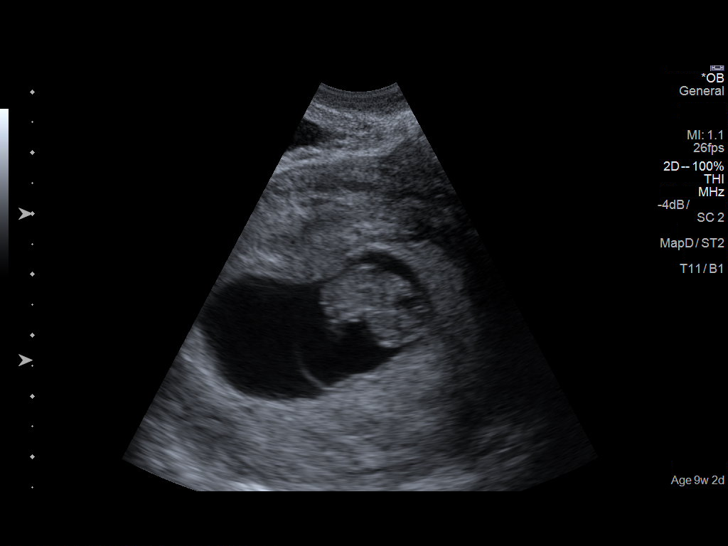
[im 101/130]
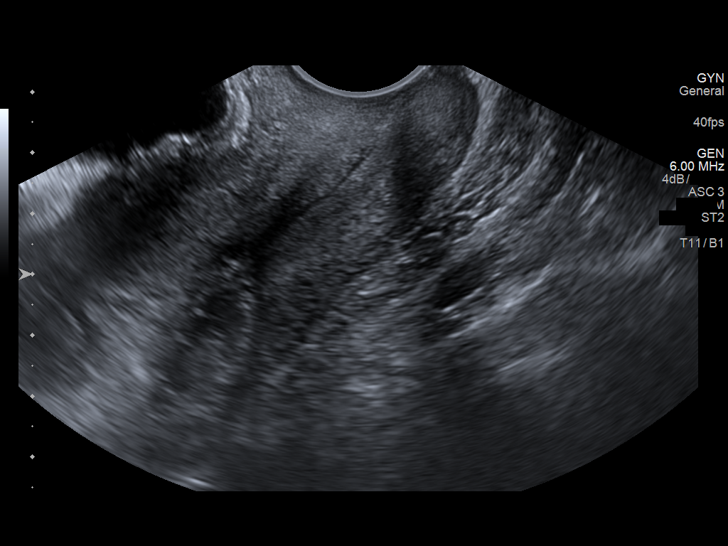
[im 110/130]
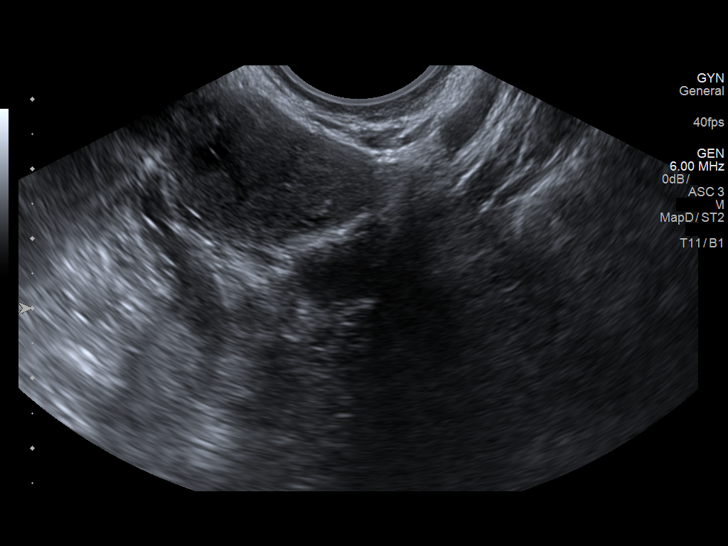
[im 120/130]
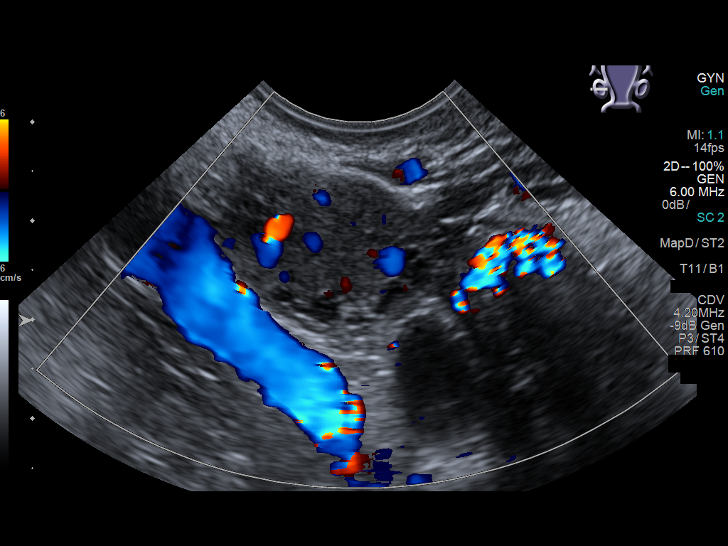
[im 130/130]
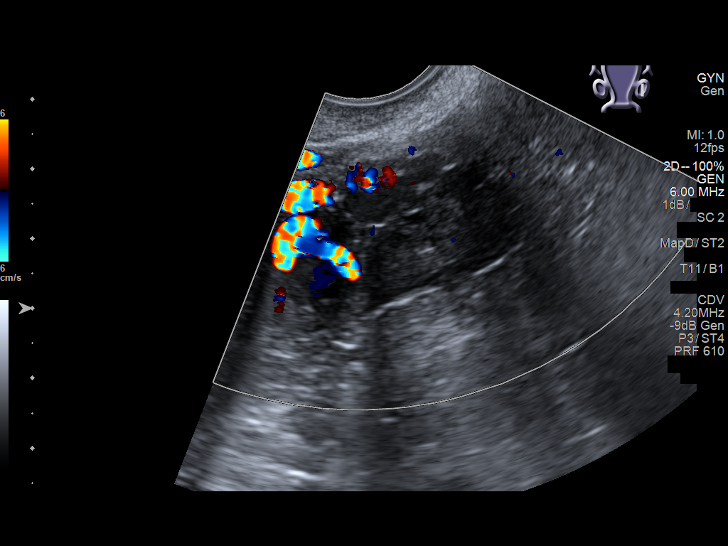

[14 of 28 positions shown; findings below may reference images not displayed]

FINDINGS: Intrauterine gestational sac: Single

Yolk sac:  Visualized.

Embryo:  Visualized.

Cardiac Activity: Visualized.

Heart Rate: 171 bpm

CRL:  20.7 mm   8 w   5 d                  US EDC: 04/01/2019

Subchorionic hemorrhage:  Small

Maternal uterus/adnexae: Normal left ovary. Corpus luteum right
ovary. No free fluid in the pelvis.
IMPRESSION: Single live intrauterine gestation.  Small subchorionic hemorrhage.

## 2023-04-15 ENCOUNTER — Emergency Department: Payer: Self-pay

## 2023-04-15 ENCOUNTER — Other Ambulatory Visit: Payer: Self-pay

## 2023-04-15 ENCOUNTER — Emergency Department
Admission: EM | Admit: 2023-04-15 | Discharge: 2023-04-15 | Disposition: A | Discharge status: Home / Self Care | Payer: Self-pay | Attending: Emergency Medicine | Admitting: Emergency Medicine

## 2023-04-15 DIAGNOSIS — J45909 Unspecified asthma, uncomplicated: Secondary | ICD-10-CM | POA: Insufficient documentation

## 2023-04-15 DIAGNOSIS — S93492A Sprain of other ligament of left ankle, initial encounter: Secondary | ICD-10-CM | POA: Insufficient documentation

## 2023-04-15 DIAGNOSIS — X500XXA Overexertion from strenuous movement or load, initial encounter: Secondary | ICD-10-CM | POA: Insufficient documentation

## 2023-04-15 NOTE — ED Triage Notes (Signed)
Pt stepped off curb wrong and twisted ankle. Slightly swollen appearing.

## 2023-04-15 NOTE — ED Provider Notes (Signed)
[facility] Provider Note    Event Date/Time   First MD Initiated Contact with Patient 04/15/23 1858     (approximate)   History   Ankle Pain   HPI  Angela Luna is a 32 y.o. female with PMH of asthma presents for evaluation of left ankle pain.  Patient stepped off the curb when taking the trash out today and twisted her ankle.  She states she felt the pop.  She has had difficulty walking on it since.      Physical Exam   Triage Vital Signs: ED Triage Vitals  Encounter Vitals Group     BP 04/15/23 1715 123/80     Systolic BP Percentile --      Diastolic BP Percentile --      Pulse Rate 04/15/23 1715 97     Resp 04/15/23 1715 16     Temp 04/15/23 1715 98 F (36.7 C)     Temp Source 04/15/23 1715 Oral     SpO2 04/15/23 1715 100 %     Weight --      Height --      Head Circumference --      Peak Flow --      Pain Score 04/15/23 1714 6     Pain Loc --      Pain Education --      Exclude from Growth Chart --     Most recent vital signs: Vitals:   04/15/23 1715 04/15/23 1907  BP: 123/80   Pulse: 97   Resp: 16   Temp: 98 F (36.7 C)   SpO2: 100% 100%   General: Awake, no distress.  CV:  Good peripheral perfusion.  Resp:  Normal effort.  Abd:  No distention.  Other:  Left lateral ankle swollen when compared to the right, no overlying skin changes or bruising, tender to palpation over the ATFL and lateral malleolus, ankle dorsiflexion limited due to pain, 4/5 strength in left ankle when compared to the right, dorsalis pedis pulse 2+ and regular.   ED Results / Procedures / Treatments   Labs (all labs ordered are listed, but only abnormal results are displayed) Labs Reviewed - No data to display   RADIOLOGY  Left ankle x-rays obtained, interpreted the images as well as reviewed the radiologist report which was negative for fracture or dislocation but does show some soft tissue swelling.  PROCEDURES:  Critical Care  performed: No  Procedures   MEDICATIONS ORDERED IN ED: Medications - No data to display   IMPRESSION / MDM / ASSESSMENT AND PLAN / ED COURSE  I reviewed the triage vital signs and the nursing notes.                             32 year old female presents for evaluation of ankle pain.  Vital signs stable in triage and patient NAD on exam.  Differential diagnosis includes, but is not limited to, ankle sprain, ankle fracture, dislocation, nerve injury, Maisonneuve fracture.  Patient's presentation is most consistent with acute complicated illness / injury requiring diagnostic workup.  Ankle fractures were negative but patient does have some soft tissue swelling.  I believe she has an ankle sprain.  She was given an ankle brace and crutches.  She can weight-bear as tolerated.  I advised her to ice and elevate her ankle.  She can have Tylenol and ibuprofen as needed for pain.  She was given  information for podiatry and I advised her to follow-up if she is not improving within a week.  She was given educational handouts on ankle sprains as well as some exercises for rehab.  She voiced understanding, all questions were answered and she was stable at discharge.     FINAL CLINICAL IMPRESSION(S) / ED DIAGNOSES   Final diagnoses:  Sprain of anterior talofibular ligament of left ankle, initial encounter     Rx / DC Orders   ED Discharge Orders     None        Note:  This document was prepared using Dragon voice recognition software and may include unintentional dictation errors.   Cameron Ali, PA-C 04/15/23 2032    Minna Antis, MD 04/15/23 [phone]

## 2023-04-15 NOTE — Discharge Instructions (Addendum)
You can take 650 mg of Tylenol and 600 mg of ibuprofen every 6 hours as needed for pain.  Follow-up with podiatry if you continue to have pain past 1 week and you are not improving.  You can bear weight as tolerated.  I would wear the ankle brace when you are up walking around but you can take it off when you are at rest and when you are sleeping.  The more you can keep your ankle elevated the less swelling you have in the last pain you will have.  You can ice as needed.

## 2023-08-14 ENCOUNTER — Telehealth: Payer: Self-pay

## 2023-08-14 NOTE — Telephone Encounter (Signed)
 Please review.  KP  Copied from CRM [id number]. Topic: Appointments - Scheduling Inquiry for Clinic >> Aug 14, 2023  1:44 PM Franchot Heidelberg wrote: Reason for CRM: Pt called reporting that her husband is established with Dr. Ashley Royalty and per a recent conversation Dr. Ashley Royalty has allegedly agreed to take her on as a new patient. Please advise   Best contact: [phone]

## 2023-09-24 ENCOUNTER — Ambulatory Visit (INDEPENDENT_AMBULATORY_CARE_PROVIDER_SITE_OTHER): Payer: Self-pay | Admitting: Family Medicine

## 2023-09-24 VITALS — BP 102/70 | HR 93 | Ht 64.0 in | Wt 202.0 lb

## 2023-09-24 DIAGNOSIS — Z136 Encounter for screening for cardiovascular disorders: Secondary | ICD-10-CM | POA: Diagnosis not present

## 2023-09-24 DIAGNOSIS — R7309 Other abnormal glucose: Secondary | ICD-10-CM | POA: Diagnosis not present

## 2023-09-24 DIAGNOSIS — E66811 Obesity, class 1: Secondary | ICD-10-CM

## 2023-09-24 DIAGNOSIS — J4520 Mild intermittent asthma, uncomplicated: Secondary | ICD-10-CM | POA: Insufficient documentation

## 2023-09-24 DIAGNOSIS — E559 Vitamin D deficiency, unspecified: Secondary | ICD-10-CM

## 2023-09-24 DIAGNOSIS — Z0000 Encounter for general adult medical examination without abnormal findings: Secondary | ICD-10-CM | POA: Diagnosis not present

## 2023-09-24 DIAGNOSIS — E6609 Other obesity due to excess calories: Secondary | ICD-10-CM

## 2023-09-24 DIAGNOSIS — Z6834 Body mass index (BMI) 34.0-34.9, adult: Secondary | ICD-10-CM | POA: Diagnosis not present

## 2023-09-24 DIAGNOSIS — F419 Anxiety disorder, unspecified: Secondary | ICD-10-CM

## 2023-09-24 DIAGNOSIS — F17210 Nicotine dependence, cigarettes, uncomplicated: Secondary | ICD-10-CM | POA: Insufficient documentation

## 2023-09-24 NOTE — Assessment & Plan Note (Signed)
 She has a history of asthma and uses an albuterol inhaler as needed, primarily during allergy season, with the last use in March 2020. She takes Zyrtec daily for allergies, with occasional missed doses in the summer. No recent need for her albuterol inhaler despite a bad allergy season, but she experiences wheezing during high allergy seasons, which improves with reduced smoking.  Asthma Asthma with infrequent albuterol use, no recent exacerbations. Smoking may exacerbate symptoms. - Continue albuterol as needed. - Continue daily Zyrtec. - Encourage smoking cessation.

## 2023-09-24 NOTE — Patient Instructions (Addendum)
 Patient Plan  Anxiety:  1. Explore mindfulness and cognitive behavioral therapy techniques to manage anxiety. 2. Consider seeing a behavioral therapist if anxiety significantly impacts your daily life. 3. Engage in regular physical activity to help reduce stress.  Nicotine Dependence:  1. Discuss the possibility of using Wellbutrin to help quit smoking when you feel ready. 2. Work on reducing and eventually quitting smoking, especially to improve asthma symptoms.  Obesity:  1. Complete the ordered lab tests: thyroid, glucose, lipid panel, CBC, hemoglobin A1c, hepatitis C antibody, TSH, and vitamin D levels. 2. Review information on weight loss medications and check with your insurance about coverage options. 3. Schedule a follow-up appointment to discuss your physical exam and weight management strategies.  Asthma:  1. Use your albuterol inhaler as needed, particularly during allergy season. 2. Continue taking Zyrtec daily for allergy management. 3. Focus on quitting smoking to help reduce asthma symptoms.  Red Flags:  - If you experience increased wheezing, shortness of breath, or any other concerning symptoms, please contact your healthcare provider immediately.  "My physician has determined that I meet the medical necessity criteria for [Wegovy/Zepbound] due to a BMI of Body mass index is 34.67 kg/m. , which classifies me as having Class 1 obesity. Given that obesity is a chronic disease with significant health risks, my provider has prescribed this medication as part of a comprehensive weight management plan to reduce my risk of obesity-related complications, including hypertension, type 2 diabetes, and cardiovascular disease. As this medication is FDA-approved for chronic weight management in individuals with a BMI >=30 or >=27 with comorbidities, I am requesting coverage in alignment with standard medical guidelines and my provider's clinical judgment."  Key Ways to Help Your  Body Reach and Maintain a Healthier "Set Point"  Gradual Calorie Reduction: Eating slightly fewer calories over time can help your body adjust to a lower weight without triggering hunger or metabolic slow-downs. Focus on balanced meals with more protein and fewer refined carbs.  Reference: Johnson Nanny, [facility], Wilding JP. Management of obesity. The Lancet. 2016.  Regular Exercise: A mix of aerobic and strength training helps maintain weight loss by boosting metabolism and burning fat. High-intensity interval training (HIIT) may be especially effective.  Reference: [employer], Montani JP. Body composition and thermogenesis in pathways to obesity. Obesity Reviews. 2012.  Mindful Eating: Paying attention to your hunger and fullness cues can help prevent overeating and lead to more sustainable weight loss.  Reference: [facility] TA. Behavioral treatment of obesity. [facility]. 2011.  Sleep and Stress Management: Improving sleep and managing stress can reduce cravings and overeating by balancing hormones that control hunger and fullness.  Reference: Ileene Mallick, Hu FB. Short sleep duration and weight gain. Obesity ([place]). 2008.  Medications: Drugs like GLP-1 receptor agonists can help reduce hunger and aid in long-term weight loss when combined with lifestyle changes.  Reference: Pi-Sunyer X. The medical management of obesity. NEJM. 2019.  Bariatric Surgery: For some, weight-loss surgery can effectively reset the body's weight set point by altering how the body processes food, leading to lasting weight loss.  Reference: Stefater MA, Wilson-Prez HE, et al. Insights from mechanistic comparisons of bariatric surgeries. Endocrine Reviews. 2012

## 2023-09-24 NOTE — Assessment & Plan Note (Signed)
 She has struggled with weight loss since the birth of her last child in October 2020. Despite engaging in regular exercise and adopting healthier eating habits, including eliminating nighttime snacking, she has not achieved significant weight loss. This lack of progress is mentally challenging for her, and she seeks assistance in managing her weight.  Obesity Persistent weight retention post-childbirth with BMI 34.6. Discussed set point theory, multifactorial weight management, and pharmacological options with insurance challenges. - Order labs: thyroid, glucose, lipid panel. - Provide information on weight loss medications and insurance coverage. - Encourage contacting insurance for medication coverage. - Schedule follow-up for physical exam and weight management discussion.

## 2023-09-24 NOTE — Progress Notes (Signed)
 Primary Care / Sports Medicine Office Visit  Patient Information:  Patient ID: Angela Luna, female DOB: 1990/12/31 Age: 33 y.o. MRN: [id number]   Angela Luna is a pleasant 32 y.o. female presenting with the following:  Chief Complaint  Patient presents with   Establish Care    Patient presents today to establish care. She is doing well today and would like to discuss weight loss options.     Vitals:   09/24/23 0945  BP: 102/70  Pulse: 93  SpO2: 98%   Vitals:   09/24/23 0945  Weight: 202 lb (91.6 kg)  Height: 5\' 4"  (1.626 m)   Body mass index is 34.67 kg/m.  No results found.   Independent interpretation of notes and tests performed by another provider:   None  Procedures performed:   None  Pertinent History, Exam, Impression, and Recommendations:   Physical Exam VITALS: P- 93, BP- 102/70 MEASUREMENTS: BMI- 34.6. NECK: No thyromegaly. CHEST: Lungs clear to auscultation bilaterally. CARDIOVASCULAR: Heart sounds normal, regular rate and rhythm.  Problem List Items Addressed This Visit     Anxiety   She experiences anxiety, particularly social anxiety, and has not been treated for it. She feels overstimulated and stressed, especially as a mother of five children, which sometimes leads her to smoke. She has not been on any medication for anxiety but acknowledges its impact on her life.  Anxiety Social anxiety contributing to stress and smoking. Discussed non-pharmacological and pharmacological treatments, emphasizing impact on daily life. - Provide information on mindfulness and cognitive behavioral therapy. - Consider referral to behavioral therapy if significant impact on daily life. - Encourage regular physical activity.      Cigarette nicotine dependence without complication   She quit smoking five years ago but occasionally smokes one or two cigarettes during stressful moments, especially when other smnokers are around. Smoking  exacerbates her asthma symptoms, particularly during high allergy seasons, and she is attempting to cut back.  Nicotine dependence Intermittent smoking influenced by social factors and anxiety. Discussed nicotine dependence components for cessation. - Discuss Wellbutrin for cessation when ready. - Encourage reduction and cessation of smoking.      Class 1 obesity due to excess calories with serious comorbidity and body mass index (BMI) of 34.0 to 34.9 in adult - Primary   She has struggled with weight loss since the birth of her last child in October 2020. Despite engaging in regular exercise and adopting healthier eating habits, including eliminating nighttime snacking, she has not achieved significant weight loss. This lack of progress is mentally challenging for her, and she seeks assistance in managing her weight.  Obesity Persistent weight retention post-childbirth with BMI 34.6. Discussed set point theory, multifactorial weight management, and pharmacological options with insurance challenges. - Order labs: thyroid, glucose, lipid panel. - Provide information on weight loss medications and insurance coverage. - Encourage contacting insurance for medication coverage. - Schedule follow-up for physical exam and weight management discussion.      Relevant Orders   Comprehensive metabolic panel with GFR   CBC   Hemoglobin A1c   Hepatitis C antibody   Lipid panel   TSH   VITAMIN D 25 Hydroxy (Vit-D Deficiency, Fractures)   Mild intermittent asthma without complication   She has a history of asthma and uses an albuterol inhaler as needed, primarily during allergy season, with the last use in March 2020. She takes Zyrtec daily for allergies, with occasional missed doses in the summer. No recent  need for her albuterol inhaler despite a bad allergy season, but she experiences wheezing during high allergy seasons, which improves with reduced smoking.  Asthma Asthma with infrequent albuterol  use, no recent exacerbations. Smoking may exacerbate symptoms. - Continue albuterol as needed. - Continue daily Zyrtec. - Encourage smoking cessation.      Other Visit Diagnoses       Healthcare maintenance       Relevant Orders   CBC     Screening for cardiovascular condition       Relevant Orders   Comprehensive metabolic panel with GFR   Lipid panel     Vitamin D deficiency       Relevant Orders   VITAMIN D 25 Hydroxy (Vit-D Deficiency, Fractures)     Abnormal glucose       Relevant Orders   Hemoglobin A1c      I provided a total time of 47 minutes including both face-to-face and non-face-to-face time on 09/24/2023 inclusive of time utilized for medical chart review, information gathering, care coordination with staff, and documentation completion.   Orders & Medications Medications: No orders of the defined types were placed in this encounter.  Orders Placed This Encounter  Procedures   Comprehensive metabolic panel with GFR   CBC   Hemoglobin A1c   Hepatitis C antibody   Lipid panel   TSH   VITAMIN D 25 Hydroxy (Vit-D Deficiency, Fractures)     Return for CPE.     Ma Saupe, MD, [facility]   Primary Care Sports Medicine Primary Care and Sports Medicine at [facility]

## 2023-09-24 NOTE — Assessment & Plan Note (Signed)
 She experiences anxiety, particularly social anxiety, and has not been treated for it. She feels overstimulated and stressed, especially as a mother of five children, which sometimes leads her to smoke. She has not been on any medication for anxiety but acknowledges its impact on her life.   Anxiety Social anxiety contributing to stress and smoking. Discussed non-pharmacological and pharmacological treatments, emphasizing impact on daily life. - Provide information on mindfulness and cognitive behavioral therapy. - Consider referral to behavioral therapy if significant impact on daily life. - Encourage regular physical activity.

## 2023-09-24 NOTE — Assessment & Plan Note (Signed)
 She quit smoking five years ago but occasionally smokes one or two cigarettes during stressful moments, especially when other smnokers are around. Smoking exacerbates her asthma symptoms, particularly during high allergy seasons, and she is attempting to cut back.  Nicotine dependence Intermittent smoking influenced by social factors and anxiety. Discussed nicotine dependence components for cessation. - Discuss Wellbutrin for cessation when ready. - Encourage reduction and cessation of smoking.

## 2023-09-25 ENCOUNTER — Encounter: Payer: Self-pay | Admitting: Family Medicine

## 2023-09-25 LAB — LIPID PANEL
Chol/HDL Ratio: 3.9 ratio (ref 0.0–4.4)
Cholesterol, Total: 175 mg/dL (ref 100–199)
HDL: 45 mg/dL (ref >39)
LDL Chol Calc (NIH): 115 mg/dL — ABNORMAL HIGH (ref 0–99)
Triglycerides: 78 mg/dL (ref 0–149)
VLDL Cholesterol Cal: 15 mg/dL (ref 5–40)

## 2023-09-25 LAB — COMPREHENSIVE METABOLIC PANEL WITH GFR
ALT: 20 IU/L (ref 0–32)
AST: 19 IU/L (ref 0–40)
Albumin: 4.7 g/dL (ref 3.9–4.9)
Alkaline Phosphatase: 65 IU/L (ref 44–121)
BUN/Creatinine Ratio: 11 (ref 9–23)
BUN: 6 mg/dL (ref 6–20)
Bilirubin Total: 0.4 mg/dL (ref 0.0–1.2)
CO2: 22 mmol/L (ref 20–29)
Calcium: 9.8 mg/dL (ref 8.7–10.2)
Chloride: 100 mmol/L (ref 96–106)
Creatinine, Ser: 0.54 mg/dL — ABNORMAL LOW (ref 0.57–1.00)
Globulin, Total: 2.2 g/dL (ref 1.5–4.5)
Glucose: 92 mg/dL (ref 70–99)
Potassium: 4.7 mmol/L (ref 3.5–5.2)
Sodium: 136 mmol/L (ref 134–144)
Total Protein: 6.9 g/dL (ref 6.0–8.5)
eGFR: 124 mL/min/{1.73_m2} (ref >59)

## 2023-09-25 LAB — CBC
Hematocrit: 41 % (ref 34.0–46.6)
Hemoglobin: 13.8 g/dL (ref 11.1–15.9)
MCH: 30.6 pg (ref 26.6–33.0)
MCHC: 33.7 g/dL (ref 31.5–35.7)
MCV: 91 fL (ref 79–97)
Platelets: 339 10*3/uL (ref 150–450)
RBC: 4.51 x10E6/uL (ref 3.77–5.28)
RDW: 12.3 % (ref 11.7–15.4)
WBC: 10.3 10*3/uL (ref 3.4–10.8)

## 2023-09-25 LAB — HEMOGLOBIN A1C
Est. average glucose Bld gHb Est-mCnc: 108 mg/dL
Hgb A1c MFr Bld: 5.4 % (ref 4.8–5.6)

## 2023-09-25 LAB — TSH: TSH: 1.54 u[IU]/mL (ref 0.450–4.500)

## 2023-09-25 LAB — HEPATITIS C ANTIBODY: Hep C Virus Ab: NONREACTIVE

## 2023-09-25 LAB — VITAMIN D 25 HYDROXY (VIT D DEFICIENCY, FRACTURES): Vit D, 25-Hydroxy: 32.3 ng/mL (ref 30.0–100.0)

## 2023-09-25 NOTE — Telephone Encounter (Signed)
 Please review and advise.   JM

## 2023-09-28 ENCOUNTER — Encounter: Payer: Self-pay | Admitting: Family Medicine

## 2023-10-08 ENCOUNTER — Encounter: Payer: Self-pay | Admitting: Family Medicine

## 2023-10-08 ENCOUNTER — Ambulatory Visit (INDEPENDENT_AMBULATORY_CARE_PROVIDER_SITE_OTHER): Payer: Self-pay | Admitting: Family Medicine

## 2023-10-08 VITALS — BP 120/80 | HR 106 | Ht 64.0 in | Wt 203.0 lb

## 2023-10-08 DIAGNOSIS — Z6834 Body mass index (BMI) 34.0-34.9, adult: Secondary | ICD-10-CM

## 2023-10-08 DIAGNOSIS — E6609 Other obesity due to excess calories: Secondary | ICD-10-CM

## 2023-10-08 DIAGNOSIS — G478 Other sleep disorders: Secondary | ICD-10-CM | POA: Diagnosis not present

## 2023-10-08 DIAGNOSIS — F17210 Nicotine dependence, cigarettes, uncomplicated: Secondary | ICD-10-CM

## 2023-10-08 DIAGNOSIS — Z0000 Encounter for general adult medical examination without abnormal findings: Secondary | ICD-10-CM | POA: Insufficient documentation

## 2023-10-08 DIAGNOSIS — R21 Rash and other nonspecific skin eruption: Secondary | ICD-10-CM | POA: Insufficient documentation

## 2023-10-08 DIAGNOSIS — E785 Hyperlipidemia, unspecified: Secondary | ICD-10-CM | POA: Insufficient documentation

## 2023-10-08 DIAGNOSIS — E66811 Obesity, class 1: Secondary | ICD-10-CM

## 2023-10-08 MED ORDER — NALTREXONE-BUPROPION HCL ER 8-90 MG PO TB12: ORAL_TABLET | ORAL | 2 refills | Status: AC

## 2023-10-08 MED ORDER — METHYLPREDNISOLONE 4 MG PO TBPK: ORAL_TABLET | ORAL | 0 refills | Status: AC

## 2023-10-08 MED ORDER — HYDROXYZINE PAMOATE 25 MG PO CAPS: 25.0000 mg | ORAL_CAPSULE | Freq: Three times a day (TID) | ORAL | 0 refills | Status: AC | PRN

## 2023-10-08 NOTE — Assessment & Plan Note (Signed)
 Cessation discussed

## 2023-10-08 NOTE — Assessment & Plan Note (Signed)
 Annual examination completed, risk stratification labs ordered, anticipatory guidance provided.  We will follow labs once resulted.

## 2023-10-08 NOTE — Progress Notes (Signed)
 Annual Physical Exam Visit  Patient Information:  Patient ID: Angela Luna, female DOB: November 15, 1990 Age: 33 y.o. MRN: [id number]   Subjective:   CC: Annual Physical Exam  HPI:  Angela Luna is here for their annual physical.  I reviewed the past medical history, family history, social history, surgical history, and allergies today and changes were made as necessary.  Please see the problem list section below for additional details.  Past Medical History: Past Medical History:  Diagnosis Date   Asthma    Past Surgical History: Past Surgical History:  Procedure Laterality Date   DIAGNOSTIC LAPAROSCOPY WITH REMOVAL OF ECTOPIC PREGNANCY  2012   Left tube   DILATION AND CURETTAGE OF UTERUS  2012   for miscarriage   keloid removal Left 2011   left ear keloid   TUBAL LIGATION Bilateral 04/2019   WISDOM TOOTH EXTRACTION Bilateral 2014   Family History: Family History  Problem Relation Age of Onset   Hypertension Mother    Allergies: Allergies  Allergen Reactions   Penicillins Hives    As a child   Health Maintenance: Health Maintenance  Topic Date Due   DTaP/Tdap/Td (1 - Tdap) Never done   Cervical Cancer Screening (HPV/Pap Cotest)  08/10/2021   COVID-19 Vaccine (1 - 2024-25 season) 10/24/2023 (Originally 01/27/2023)   Pneumococcal Vaccine 43-84 Years old (1 of 2 - PCV) 10/07/2024 (Originally 12/22/2007)   INFLUENZA VACCINE  12/27/2023   Hepatitis C Screening  Completed   HIV Screening  Completed   HPV VACCINES  Aged Out   Meningococcal B Vaccine  Aged Out    HM Colonoscopy   This patient has no relevant Health Maintenance data.    Medications: Current Outpatient Medications on File Prior to Visit  Medication Sig Dispense Refill   cetirizine (ZYRTEC) 10 MG tablet Take 10 mg by mouth daily.     No current facility-administered medications on file prior to visit.    Objective:   Vitals:   10/08/23 1008  BP: 120/80  Pulse: (!) 106  SpO2:  98%   Vitals:   10/08/23 1008  Weight: 203 lb (92.1 kg)  Height: 5\' 4"  (1.626 m)   Body mass index is 34.84 kg/m.  General: Well Developed, well nourished, and in no acute distress.  Neuro: Alert and oriented x3, extra-ocular muscles intact, sensation grossly intact. Cranial nerves II through XII are grossly intact, motor, sensory, and coordinative functions are intact. HEENT: Normocephalic, atraumatic, neck supple, no masses, no lymphadenopathy, thyroid nonenlarged. Oropharynx, nasopharynx, external ear canals are unremarkable. Skin: Warm and dry, torso scattered erythematous macules on somewhat raised and inflamed base Cardiac: Regular rate and rhythm, no murmurs rubs or gallops. No peripheral edema. Pulses symmetric. Respiratory: Clear to auscultation bilaterally. Speaking in full sentences.  Abdominal: Soft, nontender, nondistended, positive bowel sounds, no masses, no organomegaly. Musculoskeletal: Stable, and with full range of motion.  Impression and Recommendations:   The patient was counselled, risk factors were discussed, and anticipatory guidance given.  Problem List Items Addressed This Visit     Cigarette nicotine dependence without complication   Cessation discussed.      Class 1 obesity due to excess calories with serious comorbidity and body mass index (BMI) of 34.0 to 34.9 in adult   Obesity Explored weight loss medication options. Contrave preferred over Phentermine due to side effects. Mail-order pharmacy may reduce cost. - Prescribed Contrave through mail-order pharmacy with potential cost reduction. - Take as follows: One tablet (naltrexone  8 mg/bupropion 90 mg) once daily in the morning for 1 week; increase as tolerated in weekly intervals: 1 tablet twice daily for 1 week; then 2 tablets in the morning and 1 tablet in the evening for 1 week; and then 2 tablets twice daily (maximum dose: 4 tablets/day) - Scheduled follow-up in three months for weight  management.      Relevant Medications   Naltrexone-buPROPion HCl ER 8-90 MG TB12   Healthcare maintenance - Primary   Annual examination completed, risk stratification labs ordered, anticipatory guidance provided.  We will follow labs once resulted.      Hyperlipidemia   Hyperlipidemia Mildly elevated LDL cholesterol with below-average cardiovascular risk based on cholesterol ratio of 3.9. - Continue healthy lifestyle changes.      Non-restorative sleep   Nonrestorative sleep Patient describes daytime somnolence, fatigue, nonrestorative sleep, and snoring raises concern for possible sleep apnea. Discussed impact on sleep quality and health. - Referred to Sleep Medicine for evaluation and possible home sleep study.      Relevant Orders   Ambulatory referral to Pulmonology   Rash of body   Patient with history of asthma, allergies, eczema, describing recent rash to the back, and lower abdomen, this is atypical of her eczema flares which she usually experiences on her shins and the back of her hands. The flare-up began a week ago, causing significant itching, especially at night, disrupting her sleep.  No new exposures to detergents or soaps are identified. Her air conditioning failure led her to sleep with the window open at the onset of her symptoms, which she suspects may contribute to her symptoms. She is taking Zyrtec, but the itching persists. No new treatments have been used.  Body rash Rash of back and lower abdomen with severe nocturnal pruritus. - Prescribed oral steroids (Medrol Dosepak) for widespread rash distribution. - Prescribed hydroxyzine for severe pruritus, to be used nightly as needed, cautioned about drowsiness. - Advised use of air purifier to mitigate allergens from open window.      Relevant Medications   methylPREDNISolone (MEDROL DOSEPAK) 4 MG TBPK tablet   hydrOXYzine (VISTARIL) 25 MG capsule     Orders & Medications Medications:  Meds ordered this  encounter  Medications   methylPREDNISolone (MEDROL DOSEPAK) 4 MG TBPK tablet    Sig: Use as directed.    Dispense:  21 each    Refill:  0   hydrOXYzine (VISTARIL) 25 MG capsule    Sig: Take 1 capsule (25 mg total) by mouth every 8 (eight) hours as needed.    Dispense:  30 capsule    Refill:  0   Naltrexone-buPROPion HCl ER 8-90 MG TB12    Sig: One tablet (naltrexone 8 mg/bupropion 90 mg) once daily in the morning for 1 week; increase as tolerated in weekly intervals: 1 tablet twice daily for 1 week; then 2 tablets in the morning and 1 tablet in the evening for 1 week; and then 2 tablets twice daily (maximum dose: 4 tablets/day    Dispense:  120 tablet    Refill:  2   Orders Placed This Encounter  Procedures   Ambulatory referral to Pulmonology     Return in about 1 year (around 10/07/2024) for CPE.    Ma Saupe, MD, [facility]   Primary Care Sports Medicine [facility] at [facility]

## 2023-10-08 NOTE — Assessment & Plan Note (Signed)
 Hyperlipidemia Mildly elevated LDL cholesterol with below-average cardiovascular risk based on cholesterol ratio of 3.9. - Continue healthy lifestyle changes.

## 2023-10-08 NOTE — Assessment & Plan Note (Signed)
 Obesity Explored weight loss medication options. Contrave preferred over Phentermine due to side effects. Mail-order pharmacy may reduce cost. - Prescribed Contrave through mail-order pharmacy with potential cost reduction. - Take as follows: One tablet (naltrexone 8 mg/bupropion 90 mg) once daily in the morning for 1 week; increase as tolerated in weekly intervals: 1 tablet twice daily for 1 week; then 2 tablets in the morning and 1 tablet in the evening for 1 week; and then 2 tablets twice daily (maximum dose: 4 tablets/day) - Scheduled follow-up in three months for weight management.

## 2023-10-08 NOTE — Assessment & Plan Note (Signed)
 Patient with history of asthma, allergies, eczema, describing recent rash to the back, and lower abdomen, this is atypical of her eczema flares which she usually experiences on her shins and the back of her hands. The flare-up began a week ago, causing significant itching, especially at night, disrupting her sleep.  No new exposures to detergents or soaps are identified. Her air conditioning failure led her to sleep with the window open at the onset of her symptoms, which she suspects may contribute to her symptoms. She is taking Zyrtec, but the itching persists. No new treatments have been used.  Body rash Rash of back and lower abdomen with severe nocturnal pruritus. - Prescribed oral steroids (Medrol Dosepak) for widespread rash distribution. - Prescribed hydroxyzine for severe pruritus, to be used nightly as needed, cautioned about drowsiness. - Advised use of air purifier to mitigate allergens from open window.

## 2023-10-08 NOTE — Assessment & Plan Note (Signed)
 Nonrestorative sleep Patient describes daytime somnolence, fatigue, nonrestorative sleep, and snoring raises concern for possible sleep apnea. Discussed impact on sleep quality and health. - Referred to Sleep Medicine for evaluation and possible home sleep study.

## 2023-10-08 NOTE — Patient Instructions (Signed)
-   Obtain fasting labs with orders provided (can have water or black coffee but otherwise no food or drink x 8 hours before labs) - Review information provided - Attend eye doctor annually, dentist every 6 months, work towards or maintain 30 minutes of moderate intensity physical activity at least 5 days per week, and consume a balanced diet - Return in 1 year for physical - Contact us  for any questions between now and then   Patient Plan  Nicotine Dependence:  - Continue discussions and efforts towards smoking cessation.  Obesity and Weight Management:  1. Start taking Contrave through the mail-order pharmacy to reduce costs. Follow the dosage schedule:    - Week 1: One tablet in the morning.    - Week 2: One tablet twice daily.    - Week 3: Two tablets in the morning and one in the evening.    - Week 4 and beyond: Two tablets twice daily (maximum of 4 tablets per day). 2. Schedule a follow-up appointment in three months to monitor weight management progress.  Hyperlipidemia:  - Continue healthy lifestyle changes to manage cholesterol levels.  Non-Restorative Sleep:  - Attend the referral to Sleep Medicine for evaluation and consider a home sleep study to assess for sleep apnea.  Body Rash:  1. Take the prescribed Medrol Dosepak (oral steroids) for the rash. 2. Use hydroxyzine at night as needed for itching, being aware it may cause drowsiness. 3. Use an air purifier to help reduce allergens from having the window open.  Red Flags:  - If you experience any new symptoms or worsening of current symptoms, contact your healthcare provider immediately.

## 2023-10-10 ENCOUNTER — Encounter: Payer: Self-pay | Admitting: Family Medicine

## 2023-10-10 NOTE — Telephone Encounter (Signed)
 Please review and advise.   JM

## 2023-10-15 ENCOUNTER — Encounter: Payer: Self-pay | Admitting: Family Medicine

## 2023-10-15 NOTE — Telephone Encounter (Signed)
 Please review and advise.   JM

## 2023-11-12 ENCOUNTER — Ambulatory Visit: Admitting: Sleep Medicine

## 2024-01-08 ENCOUNTER — Ambulatory Visit: Admitting: Family Medicine

## 2024-10-09 ENCOUNTER — Encounter: Admitting: Family Medicine
